# Patient Record
Sex: Female | Born: 1990 | Race: Black or African American | Hispanic: No | Marital: Married | Smoking: Never smoker
Health system: Southern US, Community
[De-identification: ages and names within clinical notes are randomized; demographics above are authoritative.]

## PROBLEM LIST (undated history)

## (undated) DIAGNOSIS — I10 Essential (primary) hypertension: Secondary | ICD-10-CM

## (undated) DIAGNOSIS — Z789 Other specified health status: Secondary | ICD-10-CM

## (undated) DIAGNOSIS — E119 Type 2 diabetes mellitus without complications: Secondary | ICD-10-CM

## (undated) HISTORY — PX: PERINEAL LACERATION REPAIR: SHX5389

## (undated) HISTORY — DX: Type 2 diabetes mellitus without complications: E11.9

## (undated) HISTORY — DX: Essential (primary) hypertension: I10

## (undated) HISTORY — PX: NO PAST SURGERIES: SHX2092

---
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
## 2019-07-31 ENCOUNTER — Ambulatory Visit (INDEPENDENT_AMBULATORY_CARE_PROVIDER_SITE_OTHER): Payer: Medicaid Other | Admitting: Family Medicine

## 2019-07-31 ENCOUNTER — Other Ambulatory Visit: Payer: Self-pay

## 2019-07-31 VITALS — BP 100/62 | HR 77 | Ht 64.5 in | Wt 143.0 lb

## 2019-07-31 DIAGNOSIS — Z23 Encounter for immunization: Secondary | ICD-10-CM | POA: Diagnosis not present

## 2019-07-31 DIAGNOSIS — Z3480 Encounter for supervision of other normal pregnancy, unspecified trimester: Secondary | ICD-10-CM | POA: Diagnosis not present

## 2019-07-31 DIAGNOSIS — Z3492 Encounter for supervision of normal pregnancy, unspecified, second trimester: Secondary | ICD-10-CM | POA: Insufficient documentation

## 2019-07-31 DIAGNOSIS — Z0289 Encounter for other administrative examinations: Secondary | ICD-10-CM | POA: Diagnosis not present

## 2019-07-31 DIAGNOSIS — Z3482 Encounter for supervision of other normal pregnancy, second trimester: Secondary | ICD-10-CM

## 2019-07-31 DIAGNOSIS — Z3201 Encounter for pregnancy test, result positive: Secondary | ICD-10-CM | POA: Diagnosis not present

## 2019-07-31 DIAGNOSIS — Z1389 Encounter for screening for other disorder: Secondary | ICD-10-CM | POA: Diagnosis not present

## 2019-07-31 LAB — POCT URINALYSIS DIP (MANUAL ENTRY)
Bilirubin, UA: NEGATIVE
Blood, UA: NEGATIVE
Glucose, UA: NEGATIVE mg/dL
Ketones, POC UA: NEGATIVE mg/dL
Nitrite, UA: NEGATIVE
Protein Ur, POC: NEGATIVE mg/dL
Spec Grav, UA: 1.025 (ref 1.010–1.025)
Urobilinogen, UA: 0.2 E.U./dL
pH, UA: 6.5 (ref 5.0–8.0)

## 2019-07-31 LAB — POCT UA - MICROSCOPIC ONLY: Epithelial cells, urine per micros: >20

## 2019-07-31 LAB — POCT URINE PREGNANCY: Preg Test, Ur: POSITIVE — AB

## 2019-07-31 NOTE — Patient Instructions (Addendum)
It was great seeing you today! Please follow up with your case manager to be sure you don't miss any appointments.   We'd like to see you back on 08/16/2019 for your OB visit  but if you need to be seen earlier than that for any new issues we're happy to fit you in, just give [place] a call!   If you have questions or concerns please do not hesitate to call at [phone].  [place]

## 2019-07-31 NOTE — Progress Notes (Addendum)
[name] Date of Birth: 12-Aug-1991 Date of Visit: 07/31/19 PCP: Katelyn Malay, MD  Chief Complaint: refugee intake examination and establish prenatal care.  The patient's preferred language is Swahili. An interpreter was used for the entire visit.  Interpreter Name or ID: Katelyn Sharp    Subjective: Katelyn Sharp is a pleasant 28 y.o. presenting today for an initial refugee and immigrant clinic visit. Has no concerns today. Denies recent illness. Patient is currently pregnant. She reports some intermittent fetal movement. She denies bleeding, loss of fluid, vaginal discharge. She has irregular menses. LMP is certain at 0/16/01. EDD December 24, 2019. She is at [redacted]w[redacted]d by LMP.  ROS: See HPI.  PMH: Malaria  PSH: None  FH: No known family history of hypertension, diabetes or heart disease.   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 Primary Language: Swahili/Kiswahili Able to Read in Primary Language: Yes Able to Write in Primary Language: Yes Education: [employer] Prior Work: Worked at home Marital Status: Married Sexual Activity: Yes Tuberculosis Screening Health Department: Not Completed Health Department Labs Completed: No(obtaining today) History of Trauma: None Do You Feel Jumpy or Nervous?: No Are You Very Watchful or 'Super Alert'?: No   Date of [place]verse[place] Exam: 11/27/2018 Review of [place]verse[place] Exam: yes, chest x-ray negative  Pre-Departure Treatment: yes    Vitals:   07/31/19 1428  BP: 100/62  Pulse: 77  SpO2: 100%     HEENT: Sclera anicteric. Dentition is moderate . Appears well hydrated. Neck: Supple Cardiac: Regular rate and rhythm. Normal S1/S2. No  murmurs, rubs, or gallops appreciated. Lungs: Clear bilaterally to ascultation.  Abdomen: Normoactive bowel sounds. No tenderness to deep or light palpation. No rebound or guarding. Splenomegaly- none FH at 21 cm  FHT 150 bpm   Extremities: Warm, well perfused without edema.  Skin: no rashes, no lesions, no scars on extremities or abdomen   Psych: Pleasant and appropriate  MSK: Full ROM of arms and LE    Katelyn Sharp was seen today for establish care.  Diagnoses and all orders for this visit:  Encounter for health examination of refugee -     Hepatitis c antibody (reflex) -     QuantiFERON-TB Gold Plus -     TSH -     POCT urine pregnancy- positive  -     POCT urinalysis dipstick -     POCT UA - Microscopic Only -     Varicella zoster antibody, IgG  Supervision of other normal pregnancy, antepartum -     Obstetric Panel, Including HIV([facility]) -     Culture, OB Urine -     HGB FRAC. W/SOLUBILITY -     POCT UA - Microscopic Only - Fetal well being reassuring -  Needs dating confirmed with ultrasound  -  Follow up labs, initial Ob scheduled in [place] clinic as next available initial OB is November.   Need for immunization against influenza -     Flu Vaccine QUAD 36+ mos IM   Return to care on 08/16/2019 in [facility] with resident physician and PCP.   Vaccines: Will need Tdap. Needs anatomy scan and further review of obstetric history at follow up.    Katelyn Hensen, MD  Katelyn Singh,  MD  Family Medicine Teaching Service

## 2019-08-01 ENCOUNTER — Telehealth: Payer: Self-pay | Admitting: Family Medicine

## 2019-08-01 DIAGNOSIS — R760 Raised antibody titer: Secondary | ICD-10-CM

## 2019-08-01 DIAGNOSIS — Z3482 Encounter for supervision of other normal pregnancy, second trimester: Secondary | ICD-10-CM

## 2019-08-01 NOTE — Telephone Encounter (Signed)
Pt [place] appointment was scheduled at first available.  It is scheduled for 08/15/2019 @ 2:00pm.  She should arrive approximately 15 mins before appointment. She is to go to [place] to have this completed. Dr. Owens Shark will be contacting pt and informing her of this. Melonie Germani Zimmerman Rumple, CMA

## 2019-08-01 NOTE — Telephone Encounter (Signed)
The patient non-English speaking. An interpreter was used for the entire phone call.    Called patient regarding prenatal labs.  Antibody screen returned positive she is O+ but her antibody screen flagged for possible multiple antibodies or potential high titer.  Discussed results with patient at length.  Also discussed with husband.  Discussed that her ultrasound is scheduled currently for October 21.  Discussed location time and date of this ultrasound appointment.  Permission given to discuss with her case Arts development officer.  Called OB on call to discuss positive antibody ID.  They will discuss  with MFM and call back.  Dorris Singh, MD  Family Medicine Teaching Service

## 2019-08-01 NOTE — Telephone Encounter (Signed)
Received call back from [facility] attending on call.  Recommends the following:  -Repeat antibody screen in third trimester.  If antibodies are still present the patient should be referred over to the [facility] to have testing done at our blood bank in the hospital. This would be so that blood be available in the case that she were to need a transfusion at time of delivery. -If the patient develops significant anemia or complications of pregnancy recommend she undergo further antibody screen at that time at the blood bank so that blood could be typed and crossed for her.  Will know on problem list and place on sticky.  Dorris Singh, MD  Family Medicine Teaching Service

## 2019-08-02 LAB — HEPATITIS C ANTIBODY (REFLEX): HCV Ab: <0.1 s/co ratio (ref 0.0–0.9)

## 2019-08-02 LAB — TSH: TSH: 2.67 u[IU]/mL (ref 0.450–4.500)

## 2019-08-02 LAB — OBSTETRIC PANEL, INCLUDING HIV
Basophils Absolute: 0 10*3/uL (ref 0.0–0.2)
Basos: 0 %
EOS (ABSOLUTE): 0.4 10*3/uL (ref 0.0–0.4)
Eos: 4 %
HIV Screen 4th Generation wRfx: NONREACTIVE
Hematocrit: 33.2 % — ABNORMAL LOW (ref 34.0–46.6)
Hemoglobin: 11.2 g/dL (ref 11.1–15.9)
Hepatitis B Surface Ag: NEGATIVE
Immature Grans (Abs): 0 10*3/uL (ref 0.0–0.1)
Immature Granulocytes: 0 %
Lymphocytes Absolute: 1.3 10*3/uL (ref 0.7–3.1)
Lymphs: 17 %
MCH: 32.3 pg (ref 26.6–33.0)
MCHC: 33.7 g/dL (ref 31.5–35.7)
MCV: 96 fL (ref 79–97)
Monocytes Absolute: 0.8 10*3/uL (ref 0.1–0.9)
Monocytes: 10 %
Neutrophils Absolute: 5.5 10*3/uL (ref 1.4–7.0)
Neutrophils: 69 %
Platelets: 236 10*3/uL (ref 150–450)
RBC: 3.47 x10E6/uL — ABNORMAL LOW (ref 3.77–5.28)
RDW: 13.1 % (ref 11.7–15.4)
RPR Ser Ql: NONREACTIVE
Rh Factor: POSITIVE
Rubella Antibodies, IGG: >33 index (ref >0.99)
WBC: 8 10*3/uL (ref 3.4–10.8)

## 2019-08-02 LAB — HGB FRAC. W/SOLUBILITY
Hgb A2 Quant: 2.7 % (ref 1.8–3.2)
Hgb A: 96.6 % (ref 96.4–98.8)
Hgb C: 0 %
Hgb F Quant: 0.7 % (ref 0.0–2.0)
Hgb S: 0 %
Hgb Solubility: NEGATIVE
Hgb Variant: 0 %

## 2019-08-02 LAB — URINE CULTURE, OB REFLEX

## 2019-08-02 LAB — CULTURE, OB URINE

## 2019-08-02 LAB — QUANTIFERON-TB GOLD PLUS
QuantiFERON Mitogen Value: 6.55 IU/mL
QuantiFERON Nil Value: 0.02 IU/mL
QuantiFERON TB1 Ag Value: 0.02 IU/mL
QuantiFERON TB2 Ag Value: 0.02 IU/mL
QuantiFERON-TB Gold Plus: NEGATIVE

## 2019-08-02 LAB — HCV COMMENT:

## 2019-08-02 LAB — VARICELLA ZOSTER ANTIBODY, IGG: Varicella zoster IgG: 765 index (ref >165)

## 2019-08-02 LAB — AB SCR+ANTIBODY ID: Antibody Screen: POSITIVE — AB

## 2019-08-03 ENCOUNTER — Encounter: Payer: Self-pay | Admitting: Family Medicine

## 2019-08-08 ENCOUNTER — Encounter: Payer: Medicaid Other | Admitting: Family Medicine

## 2019-08-15 ENCOUNTER — Ambulatory Visit (HOSPITAL_COMMUNITY)
Admission: RE | Admit: 2019-08-15 | Discharge: 2019-08-15 | Disposition: A | Discharge status: Home / Self Care | Payer: Medicaid Other | Source: Ambulatory Visit | Attending: Family Medicine | Admitting: Family Medicine

## 2019-08-15 ENCOUNTER — Other Ambulatory Visit (HOSPITAL_COMMUNITY): Payer: Self-pay | Admitting: *Deleted

## 2019-08-15 ENCOUNTER — Other Ambulatory Visit: Payer: Self-pay

## 2019-08-15 DIAGNOSIS — Z362 Encounter for other antenatal screening follow-up: Secondary | ICD-10-CM

## 2019-08-15 DIAGNOSIS — Z3482 Encounter for supervision of other normal pregnancy, second trimester: Secondary | ICD-10-CM | POA: Diagnosis not present

## 2019-08-15 DIAGNOSIS — O359XX0 Maternal care for (suspected) fetal abnormality and damage, unspecified, not applicable or unspecified: Secondary | ICD-10-CM

## 2019-08-15 DIAGNOSIS — Z3A21 21 weeks gestation of pregnancy: Secondary | ICD-10-CM

## 2019-08-15 DIAGNOSIS — Z363 Encounter for antenatal screening for malformations: Secondary | ICD-10-CM

## 2019-08-16 ENCOUNTER — Encounter: Payer: Self-pay | Admitting: Family Medicine

## 2019-08-16 ENCOUNTER — Ambulatory Visit: Payer: Medicaid Other

## 2019-08-16 ENCOUNTER — Other Ambulatory Visit: Payer: Self-pay

## 2019-08-16 ENCOUNTER — Other Ambulatory Visit (HOSPITAL_COMMUNITY)
Admission: RE | Admit: 2019-08-16 | Discharge: 2019-08-16 | Disposition: A | Discharge status: Home / Self Care | Payer: Medicaid Other | Source: Ambulatory Visit | Attending: Family Medicine | Admitting: Family Medicine

## 2019-08-16 ENCOUNTER — Ambulatory Visit (INDEPENDENT_AMBULATORY_CARE_PROVIDER_SITE_OTHER): Payer: Medicaid Other | Admitting: Family Medicine

## 2019-08-16 VITALS — BP 102/60 | HR 96 | Wt 147.0 lb

## 2019-08-16 DIAGNOSIS — Z3482 Encounter for supervision of other normal pregnancy, second trimester: Secondary | ICD-10-CM

## 2019-08-16 DIAGNOSIS — Z3490 Encounter for supervision of normal pregnancy, unspecified, unspecified trimester: Secondary | ICD-10-CM | POA: Insufficient documentation

## 2019-08-16 MED ORDER — PRENATAL VITAMIN 27-0.8 MG PO TABS: 1.0000 | ORAL_TABLET | Freq: Every day | ORAL | Status: DC

## 2019-08-16 MED ORDER — POLYETHYLENE GLYCOL 3350 17 GM/SCOOP PO POWD: 17.0000 g | Freq: Every day | ORAL | 1 refills | Status: DC

## 2019-08-16 NOTE — Progress Notes (Deleted)
[name] Date of Birth: 05-29-1991 [place] Initial Prenatal   Katelyn Sharp is a 28 y.o. year old G***P*** at Unknown who presents for her initial prenatal visit. Pregnancy  {Is/is not:9024}planned She reports {pregnancy symptoms:18128}. She  {Is/is not:9024} taking a prenatal vitamin.  She denies pelvic pain or vaginal bleeding.   The patient is dated by ***.  LMP: *** Period is certain:  {ERX/VQ:00867}, if not certain LMP, needs dating ultrasound  Periods are regular:  {yes/no:20286}, if no needs dating ultrasound  Using hormonal contraception in 3 months prior to conception:  {yes/no:20286}, if yes, needs dating ultrasound  Period was typical period:  {yes/no:20286}, if no, needs dating ultrasound   Lab Review: O Antibody screen {NEGATIVE/POSITIVE FOR:19998::"Negative"} if positive, discuss with Ob on call  HIV {NEGATIVE/POSITIVE FOR:19998::"Negative"} RPR {NEGATIVE/APPRO/POSITIVE FOR:20006::"Negative"} Hemoglobin electrophoresis reviewed {yes/no:20286::"Yes"} Results of Ob urine culture are {NEGATIVE/POSITIVE FOR:19998::"Negative"} Rubella {Desc; immune/not/unknown:31571::"Immune"} Varicella status is {Desc; immune/not/unknown:31571::"Immune"}. If unknown, varicella IgG ordered. If non-immune, will need vaccination after delivery.   PMH: Reviewed and as detailed below: HTN: {yes/no:20286::"No"} if yes, consider aspirin after 12 weeks and refer to High Risk Ob  Type 1 or 2 Diabetes: {yes/no:20286::"No"} f yes, refer to High Risk Ob  Depression:  {yes/no:20286::"No"}  Seizure disorder:  {yes/no:20286::"No"} if yes, refer to Neurology and High Risk Ob  VTE: {yes/no:20286::"No"} , if yes,refer to High Risk Ob  History of STI {yes/no:20286::"No"}, if yes, repeat testing obtained  Abnormal Pap smear:  {yes/no:20286::"No"}, Genital herpes simplex:  {yes/no:20286::"No"}  if yes, valacyclovir at 36w  PSH: Gynecologic Surgery:  {No/   **:31982:o:"no"} Surgical history reviewed.   Obstetric History: Obstetric history tab updated and reviewed.  Cesarean delivery: {yes/no:20286::"No"} if yes, schedule with Dr. Kennon Rounds at 32w Gestational Diabetes:  {yes/no:20286::"No"},  obtain early 1 hour glucose tolerance test Hypertension in pregnancy: {yes/no:20286::"No"},  consider aspirin therapy starting at 12 weeks  Prior pregnancies: History of preterm birth: {YPP/JK:93267::"TI"} Complications with prior pregnancies: {Pregnancy complications:20338} History of LGA/SGA infant:  {yes/no:20286::"No"} History of shoulder dystocia: {yes/no:20286::"No"} Indications for referral were reviewed, the patient has no obstetric indications for referral to High Risk Ob at this time.   Social History: Partner's name: ***  Tobacco use: {yes/no:20286::"No"} Alcohol use:  {yes/no:20286::"No"} Other substance use:  {yes/no:20286::"No"}  Current Medications:  ***  Reviewed and appropriate in pregnancy.   Genetic and Infection Screen: Flow Sheet Updated {yes/no:20286::"Yes"}  Prenatal Exam: Gen: Well nourished, well developed.  No distress.  Vitals noted. HEENT: Normocephalic, atraumatic.  Neck supple without cervical lymphadenopathy, thyromegaly or thyroid nodules.  fair dentition. CV: RRR no murmur, gallops or rubs Lungs: CTA B.  Normal respiratory effort without wheezes or rales. Abd: soft, NTND. +BS.  Uterus not appreciated above pelvis. GU: Normal external female genitalia without lesions.  Nl vaginal, well rugated without lesions. No vaginal discharge.  Bimanual exam: No adnexal mass or TTP. No CMT.  Uterus size *** Ext: No clubbing, cyanosis or edema. Psych: Normal grooming and dress.  Not depressed or anxious appearing.  Normal thought content and process without flight of ideas or looseness of associations  Assessment/plan: 1) Pregnancy Unknown doing well.  Current pregnancy issues include ***. As dating {ACTION; IS/IS  NOT:21021397::"is not"}, a dating ultrasound {HAS HAS NOT:18834::"has"} been ordered. Dating tab updated. Prepregnancy weight updated and expected weight gain this pregnancy is {weight gain pregnancy :23296::"25-35 pounds "} Prenatal labs reviewed, notable for ***.  Indications for referral to HROB were reviewed and the patient {DOES NOT does:27190::"does  not"} meet criteria for referral.   Medication list reviewed and updated to include only medications current medications.  Recommended patient see a dentist for regular care.  Prescribed vitamin B6 and doxylamine (Diclegis), with discussion of dose titration. Bleeding and pain precautions reviewed. Importance of prenatal vitamins reviewed.  Genetic screening offered, including first trimester screen with nuchal translucency at 11-13 weeks or quad screen at 16-20 weeks.  The patient {CHL AMB IS/IS NOT:210130109} age 34 or over at time of delivery. Referral to genetics {WAS/WAS NOT:[phone]::"was not"} offered today.  The patient {DOES NOT does:27190::"does not"} meet criteria for an early 1 hour glucola testing for diabetes. She will undergo routine 1 hour screening at 24-28 weeks.  PMH and PHQ9 forms completed.  Early glucola {ACTION; IS/IS AST:41962229} indicated.    Follow up 4 weeks.

## 2019-08-16 NOTE — Progress Notes (Addendum)
[name] Date of Birth: 07/14/1991 [place] Initial Prenatal   The patient speaks Swahili as their primary language.  An interpreter was used for the entire visit.   CC: Prenatal visit, initial prenatal   HPI:  Katelyn Sharp is a 28 y.o. year old G33P3003 at [redacted]w[redacted]d who presents for her initial prenatal visit. Pregnancy  is planned She reports fatigue. No other concerns.  She  is taking a prenatal vitamin.  She denies pelvic pain or vaginal bleeding. She reports fetal movement.  The patient recently moved to [place] from [place] with her husband and 3 children.  She reports overall she is doing well.  She reports she is very at the past through and is looking forward to her fourth baby.  The patient is dated by LMP currently.  LMP: 9/56/2130 Period is certain:  Yes, if not certain LMP, needs dating ultrasound  Periods are regular:  Yes, if no needs dating ultrasound  Using hormonal contraception in 3 months prior to conception:  No, if yes, needs dating ultrasound  Period was typical period:  Yes, if no, needs dating ultrasound   Lab Review: ABO O+  Antibody screen Positive for multiple antibodies, needs repeat at next visit. Already discussed with Ob on call, see problem list.  HIV Negative RPR Negative Hemoglobin electrophoresis reviewed Yes Results of Ob urine culture are Negative Rubella Immune Varicella status is Immune.  PMH: Reviewed and as detailed below: HTN: No Type 1 or 2 Diabetes: No Depression:  No  Seizure disorder:  No VTE: No  History of STI No,  Abnormal Pap smear:  No, unknown, never had a pap  Genital herpes simplex:  No 36w  PSH: Gynecologic Surgery:  no Surgical history reviewed.   Obstetric History: Obstetric history tab updated and reviewed.  Cesarean delivery: No if yes, schedule with Dr. Kennon Rounds at 32w Gestational Diabetes:  No,  obtain early 1 hour glucose tolerance test Hypertension in pregnancy: No,   consider aspirin therapy starting at 12 weeks  Prior pregnancies: The patient has had 3 prior pregnancies.  Her G1 pregnancy was complicated by only requiring episiotomy at time of delivery this was repaired she did not require repeat episiotomy and G2 G3 pregnancy.  She has no history of needing transfusions. History of preterm birth: No Complications with prior pregnancies: Episiotomy in G1 History of LGA/SGA infant:  No History of shoulder dystocia: No Indications for referral were reviewed, the patient has no obstetric indications for referral to High Risk Ob at this time.   Social History: Partner's name: Biya   Tobacco use: No Alcohol use:  No Other substance use:  No  Current Medications:  Prenatal vitamins.   Reviewed and appropriate in pregnancy.   Genetic and Infection Screen: Flow Sheet Updated Yes  Prenatal Exam: Vitals:   08/16/19 0949  BP: 102/60  Pulse: 96    Gen: Well nourished, well developed.  No distress.  Vitals noted. Cardiac: Warm well perfused.  Capillary refill less than 3 seconds Respiratory breathing comfortably on room air Psych: Pleasant normal affect, appropriate, normal rate of speech Full exam performed at initial visit last week.  QM:VHQIONGEXB exam. Normal external female genitalia without lesions.  Nl vaginal, well rugated without lesions. No vaginal discharge.  Bimanual exam: No adnexal mass or TTP. No CMT.  Uterus size 20 weeks in gestation.  Ext: No clubbing, cyanosis or edema. Psych: Normal grooming and dress.  Not depressed or anxious appearing.  Normal  thought content and process without flight of ideas or looseness of associations  Assessment/plan: 1) Pregnancy Unknown doing well.  Current pregnancy issues include  Positive antibody screen. Reviewed MFM recommendations. Repeat at next visit and send to [facility] bank.  Patient is currently dated by LMP.  She is certain of her last menstrual period.  This is discrepant from her  ultrasound, MFM has recommended possible redating depending on growth.  Will follow closely. By ACOG criteria, the difference in dating does not meet criteria for redating.  Prepregnancy weight updated and expected weight gain this pregnancy is 25-35 pounds  Prenatal labs reviewed, notable for as above.  Indications for referral to HROB were reviewed and the patient does not meet criteria for referral.   Medication list reviewed and updated to include only medications current medications.  Recommended patient see a dentist for regular care.  Prescribed vitamin B6 and doxylamine (Diclegis), with discussion of dose titration. Bleeding and pain precautions reviewed. Importance of prenatal vitamins reviewed.  The patient is not age 30 or over at time of delivery. Referral to genetics was not offered today.  Discussed genetic testing which she declined. The patient does not meet criteria for an early 1 hour glucola testing for diabetes. She will undergo routine 1 hour screening at 24-28 weeks.  PMH  completed. PHQ not completed as not validated in Swahili.    Follow up 4 weeks.   Terisa Starr, MD  Family Medicine Teaching Service

## 2019-08-16 NOTE — Patient Instructions (Signed)
   It was wonderful to see you today.  Thank you for choosing [facility].   Please call [phone] with any questions about today's appointment.  Please be sure to schedule follow up at the front  desk before you leave today.   Dorris Singh, MD  Family Medicine   We will see you on November 10th at 1:55 PM

## 2019-08-22 LAB — CYTOLOGY - PAP
Chlamydia: NEGATIVE
Comment: NEGATIVE
Comment: NORMAL
Diagnosis: NEGATIVE
Neisseria Gonorrhea: NEGATIVE

## 2019-08-24 ENCOUNTER — Telehealth: Payer: Self-pay | Admitting: Family Medicine

## 2019-08-24 DIAGNOSIS — B3731 Acute candidiasis of vulva and vagina: Secondary | ICD-10-CM

## 2019-08-24 DIAGNOSIS — B373 Candidiasis of vulva and vagina: Secondary | ICD-10-CM

## 2019-08-24 MED ORDER — TERCONAZOLE 0.8 % VA CREA: 1.0000 | TOPICAL_CREAM | Freq: Every day | VAGINAL | 0 refills | Status: AC

## 2019-08-24 NOTE — Telephone Encounter (Signed)
Called patient with Swahili interpreter. Terconazole sent to pharmacy for vaginal yeast infection on Pap. Discussed at length.  Please confirm patient has picked up and used medication at follow up.   Dorris Singh, MD  Family Medicine Teaching Service

## 2019-09-04 ENCOUNTER — Ambulatory Visit (INDEPENDENT_AMBULATORY_CARE_PROVIDER_SITE_OTHER): Payer: Medicaid Other | Admitting: Family Medicine

## 2019-09-04 ENCOUNTER — Encounter: Payer: Self-pay | Admitting: Family Medicine

## 2019-09-04 ENCOUNTER — Other Ambulatory Visit: Payer: Self-pay

## 2019-09-04 VITALS — BP 102/60 | HR 83 | Wt 152.0 lb

## 2019-09-04 DIAGNOSIS — R768 Other specified abnormal immunological findings in serum: Secondary | ICD-10-CM

## 2019-09-04 DIAGNOSIS — Z3480 Encounter for supervision of other normal pregnancy, unspecified trimester: Secondary | ICD-10-CM | POA: Diagnosis not present

## 2019-09-04 LAB — POCT 1 HR PRENATAL GLUCOSE: Glucose 1 Hr Prenatal, POC: 103 mg/dL

## 2019-09-04 NOTE — Patient Instructions (Addendum)
Dear Katelyn Sharp,   It was good to see you! Thank you for taking your time to come in to be seen. Today, we discussed the following:   Routine Prenatal Visit Continue to take your prenatal vitamins. Please go to the MAU or call us if you have any concerns about the pregnancy.   Ilikuwa nzuri [place]! Asante kwa kuchukua muda wako [place]. Tacy Learn yafuatayo:  Ziara ya [place] Mara kwa Mara [place] kuchukua vitamini vyako kabla ya kujifungua. Tafadhali nenda kwa MAU au utupigie simu ikiwa una wasiwasi wowote juu ya ujauzito.  Be well,   Zettie Cooley, M.D   [facility] [phone]  *Sign up for MyChart for instant access to your health profile, labs, orders, upcoming appointments or to contact your provider with questions*  ===================================================================================   Please go to the Maternity Admissions Unit in the [place] at [place] will use hospital Entrance C.  Tafadhali nenda [place] Kit[place] cha [place] wa [place] Kituo cha [place] na [place] katika Hospitali ya [place]. [place] Kiingilio cha hospitali C.

## 2019-09-04 NOTE — Progress Notes (Signed)
  [name] Date of Birth: Jun 21, 1991 Date of Visit: 09/04/19 PCP: Martyn Malay, MD  Chief Complaint: prenatal care  Subjective: Katelyn Sharp is a pleasant [phone] at  24+1 dated by known LMP. Recent fetal anatomy US measured 22w+3 (1w+1d discrepancy) Working date remains LMP. She has no unusual complaints today. Reports good fetal movement. Denies contractions, loss of fluid, or bleeding.   ROS:  Review of Systems  Constitutional: Negative for malaise/fatigue.  Eyes: Negative for blurred vision.  Cardiovascular: Negative for leg swelling.  Gastrointestinal: Negative for nausea and vomiting.  Genitourinary: Negative for dysuria.  Neurological: Negative for headaches.   I have reviewed the patient's medical, surgical, family, and social history as appropriate.   Pertinent PMH: No significant past medical history   Prior Obstetric History: Episiotomy in G1   Complications of Current Pregnancy: Late prenatal care @ 21+3  Vitals:   09/04/19 1356  BP: 102/60  Pulse: 83   Last Weight  Most recent update: 09/04/2019  1:58 PM   Weight  68.9 kg (152 lb)           Pregravid weight not on file Could not be calculated Filed Weights   09/04/19 1356  Weight: 152 lb (68.9 kg)    Katelyn Sharp was seen today for routine prenatal visit.  Diagnoses and all orders for this visit:  Supervision of other normal pregnancy, antepartum -     Cancel: Antibody Screen -     POCT 1 Hr Prenatal Glucose -     Antibody screen; Future   Anticipatory Guidance and Prenatal Education provided on the following topics: - Preterm labor signs  - Reasons to present to MAU - Nutrition in pregnancy - Contraception postpartum - Breastfeed - Safe sleep for infant   Today:  - 1 hr GTT - repeat antibody screen per Dr. Owens Shark  Return in 4 weeks - Tdap at next visit  ADDENDUM ----------------------------- GTT - passed   Antibody screen positive  Spoke to [facility] attending on call, Dr.  Ilda Basset. He recommends placing referral to MFM for patient o be seen for further evaluation. Patient can continue to follow with [place] in the mean time.  - sending referral to OB/GYN- MFM  Zettie Cooley, MD [facility] Medicine Teaching Service

## 2019-09-06 LAB — ANTIBODY SCREEN: Antibody Screen: POSITIVE

## 2019-09-07 ENCOUNTER — Encounter: Payer: Self-pay | Admitting: Family Medicine

## 2019-09-07 ENCOUNTER — Telehealth: Payer: Self-pay | Admitting: Family Medicine

## 2019-09-07 DIAGNOSIS — O361920 Maternal care for other isoimmunization, second trimester, not applicable or unspecified: Secondary | ICD-10-CM | POA: Insufficient documentation

## 2019-09-07 DIAGNOSIS — O361910 Maternal care for other isoimmunization, first trimester, not applicable or unspecified: Secondary | ICD-10-CM | POA: Insufficient documentation

## 2019-09-07 NOTE — Telephone Encounter (Signed)
Called MFM to schedule consultation as recommended by Dr. Ilda Basset.   Scheduled for same day as ultrasound. Attempted to call case manager, left voicemail. Will call again next week. Also needs PNV at [facility] scheduled.   Dorris Singh, MD  Family Medicine Teaching Service

## 2019-09-10 NOTE — Telephone Encounter (Signed)
Called patient with Swahili interpreter. Total time 16 minutes coordinating care. 28 week visit scheduled at [facility]. Discussed time, date, and location of upcoming MFM appointment and ultrasound. All questions answered. Teachback method used with interpreter to confirm patient knows time/date of upcoming appointments.  Dorris Singh, MD  Family Medicine Teaching Service

## 2019-09-12 ENCOUNTER — Ambulatory Visit (HOSPITAL_COMMUNITY): Payer: Medicaid Other

## 2019-09-12 ENCOUNTER — Ambulatory Visit (HOSPITAL_COMMUNITY)
Admission: RE | Admit: 2019-09-12 | Discharge: 2019-09-12 | Disposition: A | Discharge status: Home / Self Care | Payer: Medicaid Other | Source: Ambulatory Visit | Attending: Obstetrics and Gynecology | Admitting: Obstetrics and Gynecology

## 2019-09-12 ENCOUNTER — Other Ambulatory Visit: Payer: Self-pay

## 2019-09-12 ENCOUNTER — Ambulatory Visit (HOSPITAL_COMMUNITY): Payer: Medicaid Other | Admitting: *Deleted

## 2019-09-12 ENCOUNTER — Encounter (HOSPITAL_COMMUNITY): Payer: Self-pay

## 2019-09-12 VITALS — BP 116/66 | HR 81 | Temp 97.8°F

## 2019-09-12 DIAGNOSIS — O0990 Supervision of high risk pregnancy, unspecified, unspecified trimester: Secondary | ICD-10-CM

## 2019-09-12 DIAGNOSIS — O359XX0 Maternal care for (suspected) fetal abnormality and damage, unspecified, not applicable or unspecified: Secondary | ICD-10-CM | POA: Diagnosis not present

## 2019-09-12 DIAGNOSIS — O288 Other abnormal findings on antenatal screening of mother: Secondary | ICD-10-CM

## 2019-09-12 DIAGNOSIS — Z362 Encounter for other antenatal screening follow-up: Secondary | ICD-10-CM | POA: Insufficient documentation

## 2019-09-12 DIAGNOSIS — Z3A26 26 weeks gestation of pregnancy: Secondary | ICD-10-CM | POA: Diagnosis not present

## 2019-09-14 ENCOUNTER — Telehealth: Payer: Self-pay | Admitting: Family Medicine

## 2019-09-14 DIAGNOSIS — O361920 Maternal care for other isoimmunization, second trimester, not applicable or unspecified: Secondary | ICD-10-CM

## 2019-09-14 NOTE — Telephone Encounter (Signed)
Called patient using [name].  Lab appointment on 09/17/19 at 1300.  Katelyn Sharp, [name]

## 2019-09-14 NOTE — Telephone Encounter (Signed)
Reviewed most recent ultrasound.  Ultrasound has redated patient estimated due date of 16 December 2019.    Regarding the patient's antibody titer no consultation was completed at her MFM appointment.  Called the office to find out additional information but it appears they felt this has been addressed.  Spoke with Dr. Kennon Rounds who recommended the following:  will need to send titer, will need to send every 4 weeks. If >1:16 will need referral to MFM and High Risk Ob. Can continue care with [facility] at this time.   This should be repeated every 4 weeks in pregnancy, [id number].   Nursing please call patient and help her to schedule a lab appointment next week.  This is just to check her antibody which she has previously been counseled about.  This can be any day of the week preferably earlier in the week.  The lab has been ordered as a future order.  Dorris Singh, MD  Family Medicine Teaching Service

## 2019-09-17 ENCOUNTER — Other Ambulatory Visit: Payer: Medicaid Other

## 2019-09-17 ENCOUNTER — Other Ambulatory Visit: Payer: Self-pay

## 2019-09-17 DIAGNOSIS — O361920 Maternal care for other isoimmunization, second trimester, not applicable or unspecified: Secondary | ICD-10-CM

## 2019-09-19 LAB — ANTIBODY IDENTIFICATION

## 2019-09-19 NOTE — Congregational Nurse Program (Signed)
Katelyn Sharp came in for Blood pressure check during pregnancy. She has no complains. Education provided on diet during pregnancy and importance of prenatal vitamins. Honor Loh RN BSn PCCN [phone]

## 2019-09-23 ENCOUNTER — Telehealth: Payer: Self-pay | Admitting: Family Medicine

## 2019-09-23 DIAGNOSIS — O361920 Maternal care for other isoimmunization, second trimester, not applicable or unspecified: Secondary | ICD-10-CM

## 2019-09-23 NOTE — Telephone Encounter (Signed)
Lab resulted without definitive result---patient will need repeat Type and Screen with antibody titer sent to [facility] Blood bank. This has been ordered.   Nursing- Please call patient. Her blood specimen did not result properly (lab issue, not patient issue). She needs a repeat specimen obtained this week and sent to [facility]. Routing to [place] as well.  Let me know if she has questions or if you have concerns.  Thanks!

## 2019-09-24 ENCOUNTER — Encounter: Payer: Self-pay | Admitting: Family Medicine

## 2019-09-24 NOTE — Telephone Encounter (Signed)
Called patient and she states that she has already completed this repeat lab on 09/17/2019.  Not sure if patient is confused or not.  Call was not clear and patient had to be redialed several times.  Language Resources was used via [place] # [phone].  Ozella Almond, [place]

## 2019-09-24 NOTE — Telephone Encounter (Signed)
The patient speaks Swahili as their primary language.  An interpreter was used for the entire call. Left voicemail to return call.  Tried to reach patient regarding need for blood work--she did have blood work drawn last week but it did not run properly. Needs repeat this week. Left voicemail.   Nursing- can we please try to reach her again tomorrow?  Thanks, [employer]

## 2019-09-25 DIAGNOSIS — Z3492 Encounter for supervision of normal pregnancy, unspecified, second trimester: Secondary | ICD-10-CM

## 2019-09-25 LAB — GLUCOSE, POCT (MANUAL RESULT ENTRY): POC Glucose: 92 mg/dl (ref 70–99)

## 2019-09-25 NOTE — Congregational Nurse Program (Signed)
  Dept: [place] Nurse Program Note  Date of Encounter: 09/25/2019  Past Medical History: Past Medical History:  Diagnosis Date  . Medical history non-contributory     Encounter Details: CNP Questionnaire - 09/25/19 1207      Questionnaire   Patient Status  Refugee    Race  African    Location Patient Served At  [place]  Medicaid    Uninsured  Not Applicable    Food  Yes, have food insecurities    Housing/Utilities  Yes, have permanent housing    Transportation  Yes, need transportation assistance    Interpersonal Safety  Yes, feel physically and emotionally safe where you currently live    Medication  No medication insecurities    Medical Provider  Yes    Referrals  Not Applicable    ED Visit Averted  Not Applicable    Life-Saving Intervention Made  Not Applicable      Client is pregnant in second trimestert. Came in for blood pressure screening and blood sugar check.We have educated her about prenatal vitamins and diet. Provided with Congregational Nurse phone number for any questions or concerns. Marguerite Olea, RN, CNP [phone]

## 2019-09-26 NOTE — Telephone Encounter (Signed)
Looks like she got scheduled to see dr Tammi Klippel on 12/7. Katelyn Sharp, CMA

## 2019-09-26 NOTE — Telephone Encounter (Signed)
LM with husband for her to call back and make an appt, using swahili interpreter caroline [phone]. Deseree Kennon Holter, CMA

## 2019-09-26 NOTE — Telephone Encounter (Signed)
Ideally would be this week--can you please reach out to patient as below.   Thank you,  Dorris Singh, MD  [facility] Medicine Teaching Service

## 2019-09-28 ENCOUNTER — Other Ambulatory Visit: Payer: Self-pay

## 2019-09-28 ENCOUNTER — Other Ambulatory Visit: Payer: Self-pay | Admitting: Family Medicine

## 2019-09-28 ENCOUNTER — Telehealth: Payer: Self-pay | Admitting: Family Medicine

## 2019-09-28 ENCOUNTER — Other Ambulatory Visit: Payer: Medicaid Other

## 2019-09-28 DIAGNOSIS — O361920 Maternal care for other isoimmunization, second trimester, not applicable or unspecified: Secondary | ICD-10-CM

## 2019-09-28 DIAGNOSIS — R760 Raised antibody titer: Secondary | ICD-10-CM

## 2019-09-28 NOTE — Telephone Encounter (Signed)
Patient presented for labs. Titers drawn. This was taken to Blood Bank by Herbie Baltimore. Spoke with Blood Bank - in future, will possibly need 4 lavender tubes. - [facility] picked up tubes today, will run.  Patient does NOT need antibody screen on Monday. Will still need RPR, HIV, CBC.   Dr. Tammi Klippel, let me know if you have questions.  Dorris Singh, MD  Family Medicine Teaching Service

## 2019-09-28 NOTE — Telephone Encounter (Signed)
Entered in error

## 2019-09-29 NOTE — Progress Notes (Deleted)
  [name] Date of Birth: 03/16/1991 Date of Visit: 09/29/19 PCP: Martyn Malay, MD Third Trimester Prenatal Visit   Chief Complaint: prenatal care  Subjective: Katelyn Sharp is a pleasant [phone] at  [redacted]w[redacted]d dated by 2nd trimester [place].She has no unusual complaints today.  She denies vaginal bleeding or discharge. She reports good fetal movement. She denies contractions. See flowsheet for details    ROS: per HPI.   I have reviewed the patient's medical, surgical, family, and social history as appropriate.  Labs reviewed: ABO O+, Antibody screen Positive for multiple antibodies, RPR Negative, Hemoglobin electrophoresis wnl, results of Ob urine culture are Negative, Rubella Immune, Varicella status is Immune. [place] reviewed:@[redacted]w[redacted]d , CWD, normal female anatomy, cephalic presentation, anterior placental lie, 953g, 44% EFW  Pregnancy issues include:  Positive antibody screen - Anti M Per notes from Dr. Owens Shark, Dr. Kennon Rounds was consulted and recommended that patient have repeat titers q4weeks. If >1:16 will need referral to MFM and High Risk Ob. Can continue care with [facility] at this time.   Late prenatal care Initiated care at [redacted]w[redacted]d  There were no vitals filed for this visit.  See flow sheet for exam.  Fetal heart tones documented in flow sheet  Fundal height documented in flow sheet   A/P: Katelyn Sharp is a pleasant [phone] at [redacted]w[redacted]d presenting for routine third trimester prenatal care. Overall she is doing well.   Dating reviewed, dating tab is {correct:23336::"correct"} Fetal heart tones {appropriate:23337} Fundal height {fundal height:23342::"within expected range. "} Pregnancy issues include ***. Problem list updated {yes/no:20286::"Yes"}.   Infant feeding choice: {Breasfeeding:23347::"Breastfeeding"} Contraception choice: {postpartum contraception:23348} Infant circumcision desired {Response; yes/no/na:63} The patient {CWHCS:23409::"does not have a history of Cesarean  delivery and no referral to Center for [facility] is indicated"}  Influenza vaccine previously administered.   Tdap {was/was not:19854::"was"} given today. 1 hour glucola previously obtained on 09/04/19, wnl 103 CBC, RPR, and HIV {WERE / WERE NOT:19253::"were"} obtained today.   Pregnancy medical home and PHQ-9 forms {WERE / WERE NOT:19253} done today and reviewed.   Rh status was reviewed and patient {does/does not:19886} need Rhogam.  Rhogam {WAS/WAS NOT:[phone]::"was not"} given today.   Childbirth and education classes {WERE / WERE YWV:37106} offered. Pregnancy education regarding benefits of breastfeeding, contraception, fetal growth, expected weight gain, and safe infant sleep were discussed.  Preterm labor and fetal movement precautions reviewed. Patient scheduled in [facility] during third trimester on ***.  Follow up 2 weeks.  Anticipatory Guidance and Prenatal Education provided on the following topics: - Recommended continuing Prenatal vitamin  - Discussed options for Contraception postpartum - Discussed mode of delivery - Discussed pain control in labor - Discussed  breastfeeding and benefits for infant  - Discussed safe sleep recommendations and car seat recommendations  - Problem list and prenatal box updated   Tasks ***  - At 28 weeks, 1 hour GTT, RPR, CBC, and HIV  - If Rh negative, collect 'antibody screen' and administer RhoGAM  - If history of genital herpes, valacyclovir 500 mg BID starting at 36w - Between 27-32 weeks, Tdap vaccine - Schedule in [facility] during  - PMH and PHQ form at 28 weeks  - Update Ob box in problem list  *** delete before signing   Follow up in 2 weeks until 36 weeks then weekly from 36 weeks until delivered.

## 2019-10-01 ENCOUNTER — Encounter: Payer: Medicaid Other | Admitting: Family Medicine

## 2019-10-01 ENCOUNTER — Telehealth: Payer: Self-pay | Admitting: Family Medicine

## 2019-10-01 NOTE — Telephone Encounter (Addendum)
Preliminary result of antibody screen---too weak to titer. Will repeat in 4 weeks time. Added to problem list.   Dorris Singh, MD  [facility] Medicine Teaching Service

## 2019-10-04 ENCOUNTER — Other Ambulatory Visit: Payer: Self-pay

## 2019-10-04 ENCOUNTER — Ambulatory Visit (INDEPENDENT_AMBULATORY_CARE_PROVIDER_SITE_OTHER): Payer: Medicaid Other | Admitting: Obstetrics and Gynecology

## 2019-10-04 ENCOUNTER — Encounter: Payer: Self-pay | Admitting: Obstetrics and Gynecology

## 2019-10-04 VITALS — BP 116/76 | HR 94 | Wt 151.0 lb

## 2019-10-04 DIAGNOSIS — O361920 Maternal care for other isoimmunization, second trimester, not applicable or unspecified: Secondary | ICD-10-CM

## 2019-10-04 DIAGNOSIS — Z3A29 29 weeks gestation of pregnancy: Secondary | ICD-10-CM | POA: Diagnosis not present

## 2019-10-04 DIAGNOSIS — Z3480 Encounter for supervision of other normal pregnancy, unspecified trimester: Secondary | ICD-10-CM

## 2019-10-04 DIAGNOSIS — Z23 Encounter for immunization: Secondary | ICD-10-CM | POA: Diagnosis not present

## 2019-10-04 DIAGNOSIS — Z789 Other specified health status: Secondary | ICD-10-CM | POA: Diagnosis not present

## 2019-10-04 DIAGNOSIS — O361930 Maternal care for other isoimmunization, third trimester, not applicable or unspecified: Secondary | ICD-10-CM

## 2019-10-04 MED ORDER — BLOOD PRESSURE MONITORING DEVI: 1.0000 | 0 refills | Status: DC

## 2019-10-04 NOTE — Progress Notes (Signed)
INITIAL PRENATAL VISIT NOTE  Subjective:  Katelyn Sharp is a 28 y.o. G4P3003 at [redacted]w[redacted]d by 2nd trim [place] being seen today for her initial prenatal visit. She is transferring from [facility] for antibodies. She has an obstetric history significant for 3 x term SVD. She has a medical history significant for .  Patient reports no complaints.  Contractions: Not present. Vag. Bleeding: None.  Movement: Present. Denies leaking of fluid.    Past Medical History:  Diagnosis Date  . Medical history non-contributory     Past Surgical History:  Procedure Laterality Date  . PERINEAL LACERATION REPAIR     G1 pregnancy     OB History  Gravida Para Term Preterm AB Living  4 3 3  0 0 3  SAB TAB Ectopic Multiple Live Births               # Outcome Date GA Lbr Len/2nd Weight Sex Delivery Anes PTL Lv  4 Current           3 Term           2 Term           1 Term             Obstetric Comments  G1- 2014- SVD of boy, 2.5 kg- had episiotomy in G1    G2- 2016- [name], SVD, girl, 2.5 kg   G3- [name]- 2018- SVD, 2.5 kg     Social History   Socioeconomic History  . Marital status: Married    Spouse name: Not on file  . Number of children: Not on file  . Years of education: Not on file  . Highest education level: Not on file  Occupational History  . Not on file  Tobacco Use  . Smoking status: Never Smoker  . Smokeless tobacco: Never Used  Substance and Sexual Activity  . Alcohol use: Never  . Drug use: Never  . Sexual activity: Yes    Partners: Male    Birth control/protection: None  Other Topics Concern  . 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 Difficulty of Paying Living Expenses: Not on file  Food Insecurity:   . Worried About Charity fundraiser in the Last Year: Not on file  . Ran Out of Food in the Last Year: Not on file  Transportation Needs:   . Lack of Transportation (Medical): Not on file  . Lack of Transportation (Non-Medical): Not on file  Physical Activity:   . Days of Exercise per Week: Not  on file  . Minutes of Exercise per Session: Not on file  Stress:   . Feeling of Stress : Not on file  Social Connections:   . Frequency of Communication with Friends and Family: Not on file  . Frequency of Social Gatherings with Friends and Family: Not on file  . Attends Religious Services: Not on file  . Active Member of Clubs or Organizations: Not on file  . Attends Banker Meetings: Not on file  . Marital Status: Not on file    Family History  Problem Relation Age of Onset  . Bleeding Disorder Neg Hx   . Hypertension Neg Hx   . Diabetes Neg Hx      Current Outpatient Medications:  .  polyethylene glycol powder (GLYCOLAX/MIRALAX) 17 GM/SCOOP powder, Take 17 g by mouth daily. Mix 1 capful with 8 ounces, Disp: 3350 g, Rfl: 1 .  Prenatal Vit-Fe Fumarate-FA (PRENATAL VITAMIN) 27-0.8 MG TABS, Take 1 tablet by mouth daily., Disp: 30 tablet, Rfl:   No Known Allergies  Review of Systems: Negative except for what is mentioned in HPI.  Objective:   Vitals:   10/04/19 0857  BP: 116/76  Pulse: 94  Weight: 151 lb  (68.5 kg)    Fetal Status: Fetal Heart Rate (bpm): 143   Movement: Present     Physical Exam: BP 116/76   Pulse 94   Wt 151 lb (68.5 kg)   LMP 03/19/2019 (Exact Date)   BMI 25.52 kg/m  CONSTITUTIONAL: Well-developed, well-nourished female in no acute distress.  NEUROLOGIC: Alert and oriented to person, place, and time. Normal reflexes, muscle tone coordination. No cranial nerve deficit noted. PSYCHIATRIC: Normal mood and affect. Normal behavior. Normal judgment and thought content. SKIN: Skin is warm and dry. No rash noted. Not diaphoretic. No erythema. No pallor. HENT:  Normocephalic, atraumatic, External right and left ear normal. Oropharynx is clear and moist EYES: Conjunctivae and EOM are normal. Pupils are equal, round, and reactive to light. No scleral icterus.  NECK: Normal range of motion, supple, no masses CARDIOVASCULAR: Normal heart rate noted, regular rhythm RESPIRATORY: Effort and breath sounds normal, no problems with respiration noted BREASTS: deferred ABDOMEN: Soft, nontender, nondistended, gravid. GU: deferred MUSCULOSKELETAL: Normal range of motion. EXT:  No edema and no tenderness. 2+ distal pulses.  Assessment and Plan:  Pregnancy: G4P3003 at [redacted]w[redacted]d by 2nd trim [place]  1. Supervision of other normal pregnancy, antepartum - Tdap today - return for 3rd trim labs - reviewed options for contraception - reviewed to go to [facility] for any pregnancy related issues  2. Language barrier - Swahili interpretor used  3. 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  Please refer to After Visit Summary for other counseling recommendations.   Return in  about 2 weeks (around 10/18/2019) for high OB, in person.  Conan Bowens 10/04/2019 9:17 AM

## 2019-10-04 NOTE — Addendum Note (Signed)
Addended by: Bethanne Ginger on: 10/04/2019 09:41 AM   Modules accepted: Orders

## 2019-10-04 NOTE — Patient Instructions (Addendum)
Go to the [facility] [place] [facility] at [facility] Address: [address] [place], [place], [place] [address] Phone: [phone]    Use the following websites (and others) to help learn more about your contraception options and find the method that is right for you!  - The Centers for Disease Control (CDC) website: MomentumMarket.be  - Planned Parenthood website: https://www.plannedparenthood.org/learn/birth-control  - Bedsider.org: https://www.bedsider.org/methods    Contraception Choices Contraception, also called birth control, refers to methods or devices that prevent pregnancy. Hormonal methods Contraceptive implant  A contraceptive implant is a thin, plastic tube that contains a hormone. It is inserted into the upper part of the arm. It can remain in place for up to 3 years. Progestin-only injections Progestin-only injections are injections of progestin, a synthetic form of the hormone progesterone. They are given every 3 months by a health care provider. Birth control pills  Birth control pills are pills that contain hormones that prevent pregnancy. They must be taken once a day, preferably at the same time each day. Birth control patch  The birth control patch contains hormones that prevent pregnancy. It is placed on the skin and must be changed once a week for three weeks and removed on the fourth week. A prescription is needed to use this method of contraception. Vaginal ring  A vaginal ring contains hormones that prevent pregnancy. It is placed in the vagina for three weeks and removed on the fourth week. After that, the process is repeated with a new ring. A prescription is needed to use this method of contraception. Emergency contraceptive Emergency contraceptives prevent pregnancy after unprotected sex. They come in pill form and can be taken up to 5 days after sex. They work best the sooner they are taken  after having sex. Most emergency contraceptives are available without a prescription. This method should not be used as your only form of birth control. Barrier methods Female condom  A female condom is a thin sheath that is worn over the penis during sex. Condoms keep sperm from going inside a woman's body. They can be used with a spermicide to increase their effectiveness. They should be disposed after a single use. Female condom  A female condom is a soft, loose-fitting sheath that is put into the vagina before sex. The condom keeps sperm from going inside a woman's body. They should be disposed after a single use. Diaphragm  A diaphragm is a soft, dome-shaped barrier. It is inserted into the vagina before sex, along with a spermicide. The diaphragm blocks sperm from entering the uterus, and the spermicide kills sperm. A diaphragm should be left in the vagina for 6-8 hours after sex and removed within 24 hours. A diaphragm is prescribed and fitted by a health care provider. A diaphragm should be replaced every 1-2 years, after giving birth, after gaining more than 15 lb (6.8 kg), and after pelvic surgery. Cervical cap  A cervical cap is a round, soft latex or plastic cup that fits over the cervix. It is inserted into the vagina before sex, along with spermicide. It blocks sperm from entering the uterus. The cap should be left in place for 6-8 hours after sex and removed within 48 hours. A cervical cap must be prescribed and fitted by a health care provider. It should be replaced every 2 years. Sponge  A sponge is a soft, circular piece of polyurethane foam with spermicide on it. The sponge helps block sperm from entering the uterus, and the spermicide  kills sperm. To use it, you make it wet and then insert it into the vagina. It should be inserted before sex, left in for at least 6 hours after sex, and removed and thrown away within 30 hours. Spermicides Spermicides are chemicals that kill or block  sperm from entering the cervix and uterus. They can come as a cream, jelly, suppository, foam, or tablet. A spermicide should be inserted into the vagina with an applicator at least 10-15 minutes before sex to allow time for it to work. The process must be repeated every time you have sex. Spermicides do not require a prescription. Intrauterine contraception Intrauterine device (IUD) An IUD is a T-shaped device that is put in a woman's uterus. There are two types:  Hormone IUD.This type contains progestin, a synthetic form of the hormone progesterone. This type can stay in place for 3-5 years.  Copper IUD.This type is wrapped in copper wire. It can stay in place for 10 years.  Permanent methods of contraception Female tubal ligation In this method, a woman's fallopian tubes are sealed, tied, or blocked during surgery to prevent eggs from traveling to the uterus. Hysteroscopic sterilization In this method, a small, flexible insert is placed into each fallopian tube. The inserts cause scar tissue to form in the fallopian tubes and block them, so sperm cannot reach an egg. The procedure takes about 3 months to be effective. Another form of birth control must be used during those 3 months. Female sterilization This is a procedure to tie off the tubes that carry sperm (vasectomy). After the procedure, the man can still ejaculate fluid (semen). Natural planning methods Natural family planning In this method, a couple does not have sex on days when the woman could become pregnant. Calendar method This means keeping track of the length of each menstrual cycle, identifying the days when pregnancy can happen, and not having sex on those days. Ovulation method In this method, a couple avoids sex during ovulation. Symptothermal method This method involves not having sex during ovulation. The woman typically checks for ovulation by watching changes in her temperature and in the consistency of cervical  mucus. Post-ovulation method In this method, a couple waits to have sex until after ovulation. Summary  Contraception, also called birth control, means methods or devices that prevent pregnancy.  Hormonal methods of contraception include implants, injections, pills, patches, vaginal rings, and emergency contraceptives.  Barrier methods of contraception can include female condoms, female condoms, diaphragms, cervical caps, sponges, and spermicides.  There are two types of IUDs (intrauterine devices). An IUD can be put in a woman's uterus to prevent pregnancy for 3-5 years.  Permanent sterilization can be done through a procedure for males, females, or both.  Natural family planning methods involve not having sex on days when the woman could become pregnant. This information is not intended to replace advice given to you by your health care provider. Make sure you discuss any questions you have with your health care provider. Document Released: 10/11/2005 Document Revised: 10/13/2017 Document Reviewed: 11/13/2016 Elsevier Patient Education  2020 [employer].

## 2019-10-05 DIAGNOSIS — Z3480 Encounter for supervision of other normal pregnancy, unspecified trimester: Secondary | ICD-10-CM | POA: Diagnosis not present

## 2019-10-08 ENCOUNTER — Other Ambulatory Visit: Payer: Self-pay | Admitting: *Deleted

## 2019-10-08 DIAGNOSIS — Z3480 Encounter for supervision of other normal pregnancy, unspecified trimester: Secondary | ICD-10-CM

## 2019-10-10 ENCOUNTER — Telehealth: Payer: Self-pay

## 2019-10-10 NOTE — Telephone Encounter (Signed)
I have arranged transportation to and from the [facility] office tomorrow Dec 17th. Client will be picked up at 9:17 am.I have informed the patient of the the same. Honor Loh RN BSN PCCN  [phone]

## 2019-10-10 NOTE — Congregational Nurse Program (Signed)
  Dept: [facility] Nurse Program Note  Date of Encounter: 10/03/2019  Past Medical History: Past Medical History:  Diagnosis Date  . Medical history non-contributory     Encounter Details: client reports to be doing well. She is requesting for assistance with transportation to and from the Dr`s office. She has appointment tomorrow at 10am.I will arrange transportation. Honor Loh RN BSN PCCN  [phone]

## 2019-10-11 ENCOUNTER — Other Ambulatory Visit: Payer: Medicaid Other

## 2019-10-11 ENCOUNTER — Other Ambulatory Visit: Payer: Self-pay

## 2019-10-11 DIAGNOSIS — Z3480 Encounter for supervision of other normal pregnancy, unspecified trimester: Secondary | ICD-10-CM | POA: Diagnosis not present

## 2019-10-12 LAB — CBC
Hematocrit: 30.9 % — ABNORMAL LOW (ref 34.0–46.6)
Hemoglobin: 10.5 g/dL — ABNORMAL LOW (ref 11.1–15.9)
MCH: 32.3 pg (ref 26.6–33.0)
MCHC: 34 g/dL (ref 31.5–35.7)
MCV: 95 fL (ref 79–97)
Platelets: 227 10*3/uL (ref 150–450)
RBC: 3.25 x10E6/uL — ABNORMAL LOW (ref 3.77–5.28)
RDW: 12.5 % (ref 11.7–15.4)
WBC: 6.2 10*3/uL (ref 3.4–10.8)

## 2019-10-12 LAB — GLUCOSE TOLERANCE, 2 HOURS W/ 1HR
Glucose, 1 hour: 147 mg/dL (ref 65–179)
Glucose, 2 hour: 134 mg/dL (ref 65–152)
Glucose, Fasting: 82 mg/dL (ref 65–91)

## 2019-10-12 LAB — RPR: RPR Ser Ql: NONREACTIVE

## 2019-10-12 LAB — HIV ANTIBODY (ROUTINE TESTING W REFLEX): HIV Screen 4th Generation wRfx: NONREACTIVE

## 2019-10-15 ENCOUNTER — Other Ambulatory Visit: Payer: Self-pay

## 2019-10-15 ENCOUNTER — Telehealth: Payer: Self-pay | Admitting: Family Medicine

## 2019-10-15 ENCOUNTER — Ambulatory Visit (INDEPENDENT_AMBULATORY_CARE_PROVIDER_SITE_OTHER): Payer: Medicaid Other | Admitting: Family Medicine

## 2019-10-15 VITALS — BP 110/65 | HR 79 | Wt 155.2 lb

## 2019-10-15 DIAGNOSIS — Z3483 Encounter for supervision of other normal pregnancy, third trimester: Secondary | ICD-10-CM

## 2019-10-15 NOTE — Telephone Encounter (Signed)
Per chart review, [facility] to [facility] [place]  There is a note from 12/10 of an initial prenatal visit with Dr. Rosana Hoes.  I have also confirmed this with a staff message to Dr. Owens Shark.  She is also seeing maternal-fetal medicine.  Given that patient is seeing OB/GYN, we can cancel her prenatal visit with [place] today as she will be following up with OB. She has a scheduled appointment on 12/29 with Dr. Nehemiah Settle  Please call patient and cancel appointment on 12/21 with me so she is not seeing 2 providers.  Dalphine Handing, PGY-3 [facility] 10/15/2019 9:34 AM

## 2019-10-15 NOTE — Progress Notes (Signed)
  [name] Date of Birth: November 11, 1990 Date of Visit: 10/15/19 PCP: Martyn Malay, MD Third Trimester Prenatal Visit   Chief Complaint: prenatal care  Subjective: [name] is a pleasant [phone] at  [redacted]w[redacted]d. She has no unusual complaints today.  She denies vaginal bleeding or discharge. She reports good fetal movement. She denies contractions. Per chart review patient has been seeing [facility] for prenatal care, next appointment scheduled on 12/29 with Dr. Nehemiah Settle. Patient reports she only came today because she was told this appointment was scheduled for her, was not sure why she was coming in today.   ROS: per HPI.   See flow sheet for exam.  Fetal heart tones documented in flow sheet  Fundal height documented in flow sheet   A/P: [name] is a pleasant [phone] at [redacted]w[redacted]d presenting for routine third trimester prenatal care. Overall she is doing well. Given that patient is seeing [facility] she will no longer need to have routine prenatal care at [facility]. I explained this to patient. FHT are wnl and patient is feeling good movement and no leakage of fluid or blood. Patient states she would like to continue prenatal care at [facility] and informed of date, time, and location of her next appointment. AVS printed so she could have this information in writing as well.   Strict return precautions given. Follow up with [facility] for continued OB care. No charge for this appointment, dicussed with Dr. Ardelia Mems who agrees with plan.

## 2019-10-22 DIAGNOSIS — Z3480 Encounter for supervision of other normal pregnancy, unspecified trimester: Secondary | ICD-10-CM | POA: Diagnosis not present

## 2019-10-23 ENCOUNTER — Encounter: Payer: Self-pay | Admitting: Family Medicine

## 2019-10-23 ENCOUNTER — Telehealth (INDEPENDENT_AMBULATORY_CARE_PROVIDER_SITE_OTHER): Payer: Medicaid Other | Admitting: Family Medicine

## 2019-10-23 ENCOUNTER — Other Ambulatory Visit: Payer: Self-pay

## 2019-10-23 DIAGNOSIS — O361930 Maternal care for other isoimmunization, third trimester, not applicable or unspecified: Secondary | ICD-10-CM

## 2019-10-23 DIAGNOSIS — Z789 Other specified health status: Secondary | ICD-10-CM

## 2019-10-23 DIAGNOSIS — O99013 Anemia complicating pregnancy, third trimester: Secondary | ICD-10-CM | POA: Diagnosis not present

## 2019-10-23 DIAGNOSIS — Z3A32 32 weeks gestation of pregnancy: Secondary | ICD-10-CM | POA: Diagnosis not present

## 2019-10-23 DIAGNOSIS — O361920 Maternal care for other isoimmunization, second trimester, not applicable or unspecified: Secondary | ICD-10-CM

## 2019-10-23 DIAGNOSIS — Z3480 Encounter for supervision of other normal pregnancy, unspecified trimester: Secondary | ICD-10-CM

## 2019-10-23 MED ORDER — FERROUS GLUCONATE 324 (38 FE) MG PO TABS: 324.0000 mg | ORAL_TABLET | Freq: Every day | ORAL | 3 refills | Status: DC

## 2019-10-23 MED ORDER — PRENATAL VITAMIN 27-0.8 MG PO TABS: 1.0000 | ORAL_TABLET | Freq: Every day | ORAL | 6 refills | Status: DC

## 2019-10-23 NOTE — Progress Notes (Signed)
   TELEHEALTH VIRTUAL OBSTETRICS VISIT ENCOUNTER NOTE  I connected with Katelyn Sharp on 10/23/19 at  8:35 AM EST by telephone at home and verified that I am speaking with the correct person using two identifiers.   I discussed the limitations, risks, security and privacy concerns of performing an evaluation and management service by telephone and the availability of in person appointments. I also discussed with the patient that there may be a patient responsible charge related to this service. The patient expressed understanding and agreed to proceed.  Subjective:  Katelyn Sharp is a 28 y.o. G4P3003 at [redacted]w[redacted]d being followed for ongoing prenatal care.  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  Patient reports no complaints. Reports fetal movement. Denies any contractions, bleeding or leaking of fluid.   The following portions of the patient's history were reviewed and updated as appropriate: allergies, current medications, past family history, past medical history, past social history, past surgical history and problem list.   Objective:   General:  Alert, oriented and cooperative.   Mental Status: Normal mood and affect perceived. Normal judgment and thought content.  Rest of physical exam deferred due to type of encounter  Assessment and Plan:  Pregnancy: G4P3003 at [redacted]w[redacted]d 1. Supervision of other normal pregnancy, antepartum Good fetal movement - Prenatal Vit-Fe Fumarate-FA (PRENATAL VITAMIN) 27-0.8 MG TABS; Take 1 tablet by mouth daily.  Dispense: 30 tablet; Refill: 6  2. Anti-M isoimmunization affecting pregnancy in  second trimester Will need type and crossmatch on admission  3. Language barrier Interpreter used  4. Anemia Iron supplementation  Preterm labor symptoms and general obstetric precautions including but not limited to vaginal bleeding, contractions, leaking of fluid and fetal movement were reviewed in detail with the patient.  I discussed the assessment and treatment plan with the patient. The patient was provided an opportunity to ask questions and all were answered. The patient agreed with the plan and demonstrated an understanding of the instructions. The patient was advised to call back or seek an in-person office evaluation/go to MAU at [facility] for any urgent or concerning symptoms. Please refer to After Visit Summary for other counseling recommendations.   I provided 9 minutes of non-face-to-face time during this encounter.  Return in about 2 weeks (around 11/06/2019) for HR OB f/u, In Office.  No future appointments.  [place] for [employer], [place]

## 2019-10-23 NOTE — Progress Notes (Signed)
I connected with  Katelyn Sharp on 10/23/19 at  8:35 AM EST by telephone and verified that I am speaking with the correct person using two identifiers.   I discussed the limitations, risks, security and privacy concerns of performing an evaluation and management service by telephone and the availability of in person appointments. I also discussed with the patient that there may be a patient responsible charge related to this service. The patient expressed understanding and agreed to proceed.  Derinda Late, RN 10/23/2019  9:06 AM

## 2019-10-26 NOTE — L&D Delivery Note (Signed)
OB/GYN Faculty Practice Delivery Note  Katelyn Sharp is a 29 y.o. [id number] s/p VD at [redacted]w[redacted]d. She was admitted for SOL.   ROM: Just prior to delivery GBS Status: Negative/-- (01/29 1022)  Labor Progress: . Patient presented to L&D for SOL. Initial SVE: 7/90/-1. SROM occurred. She then progressed to complete.   Delivery Date/Time: 2/19 @ 1809 Delivery: Followed patient to room from MAU. SROM occurred in transit and patient was then complete. Head delivered in ROA position with compound right hand. No nuchal cord present. Shoulder and body delivered in usual fashion. Infant with spontaneous cry, placed on mother's abdomen, dried and stimulated. Cord clamped x 2 after 1-minute delay, and cut by grandmother. Cord blood drawn. Placenta delivered spontaneously with gentle cord traction. Fundus firm with massage and Pitocin. Labia, perineum, vagina, and cervix inspected inspected with 1st degree perineal laceration which was repaired with 3-0 Vicryl in a standard fashion.  Baby Weight: pending  Placenta: Sent to L&D Complications: None Lacerations: 1st degree perineal laceration EBL: 120 mL Analgesia: local lidocaine for repair   Infant: APGAR (1 MIN): 9   APGAR (5 MINS): 9   APGAR (10 MINS):     Jerilynn Birkenhead, MD [facility] Fellow, [facility] for [facility], [facility] 12/14/2019, 6:47 PM

## 2019-11-07 ENCOUNTER — Encounter: Payer: Medicaid Other | Admitting: Obstetrics & Gynecology

## 2019-11-08 ENCOUNTER — Other Ambulatory Visit: Payer: Self-pay

## 2019-11-08 ENCOUNTER — Ambulatory Visit (INDEPENDENT_AMBULATORY_CARE_PROVIDER_SITE_OTHER): Payer: Medicaid Other | Admitting: Obstetrics & Gynecology

## 2019-11-08 ENCOUNTER — Telehealth: Payer: Self-pay

## 2019-11-08 VITALS — BP 107/62 | HR 77 | Wt 156.3 lb

## 2019-11-08 DIAGNOSIS — Z3482 Encounter for supervision of other normal pregnancy, second trimester: Secondary | ICD-10-CM

## 2019-11-08 NOTE — Telephone Encounter (Signed)
I have called Center for [facility] at [facility] and confirmed that patient has an appointment scheduled for today at 1555hrs. Patient was unaware. The office did call her but she did not answer. I have called Ms Marteney and informed her of the appointment scheduled for today.She will be there at the scheduled time. Arman Bogus RN BSN PCCN [phone]

## 2019-11-08 NOTE — Patient Instructions (Signed)

## 2019-11-22 ENCOUNTER — Telehealth: Payer: Self-pay

## 2019-11-22 NOTE — Telephone Encounter (Signed)
Ms Katelyn Sharp requested transportation to and from [employer] office. Transport arrangement done with [employer]. I have informed [name] that she will be picked up at 07:45 am on 11/23/19. Arman Bogus RN BSN PCCN [phone]

## 2019-11-23 ENCOUNTER — Other Ambulatory Visit: Payer: Self-pay

## 2019-11-23 ENCOUNTER — Other Ambulatory Visit (HOSPITAL_COMMUNITY)
Admission: RE | Admit: 2019-11-23 | Discharge: 2019-11-23 | Disposition: A | Discharge status: Home / Self Care | Payer: Medicaid Other | Source: Ambulatory Visit | Attending: Family Medicine | Admitting: Family Medicine

## 2019-11-23 ENCOUNTER — Ambulatory Visit (INDEPENDENT_AMBULATORY_CARE_PROVIDER_SITE_OTHER): Payer: Medicaid Other | Admitting: Family Medicine

## 2019-11-23 VITALS — BP 129/77 | HR 75 | Wt 159.2 lb

## 2019-11-23 DIAGNOSIS — O361920 Maternal care for other isoimmunization, second trimester, not applicable or unspecified: Secondary | ICD-10-CM

## 2019-11-23 DIAGNOSIS — Z3480 Encounter for supervision of other normal pregnancy, unspecified trimester: Secondary | ICD-10-CM

## 2019-11-23 DIAGNOSIS — O361930 Maternal care for other isoimmunization, third trimester, not applicable or unspecified: Secondary | ICD-10-CM

## 2019-11-23 DIAGNOSIS — Z789 Other specified health status: Secondary | ICD-10-CM

## 2019-11-23 DIAGNOSIS — Z3A36 36 weeks gestation of pregnancy: Secondary | ICD-10-CM

## 2019-11-23 DIAGNOSIS — Z758 Other problems related to medical facilities and other health care: Secondary | ICD-10-CM

## 2019-11-23 DIAGNOSIS — O99013 Anemia complicating pregnancy, third trimester: Secondary | ICD-10-CM

## 2019-11-23 NOTE — Patient Instructions (Signed)

## 2019-11-23 NOTE — Progress Notes (Signed)
   PRENATAL VISIT NOTE  Subjective:  Katelyn Sharp is a 29 y.o. G4P3003 at [redacted]w[redacted]d being seen today for ongoing prenatal care.  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  Patient reports no complaints.  Contractions: Not present. Vag. Bleeding: None.  Movement: Present. Denies leaking of fluid.   The following portions of the patient's history were reviewed and updated as appropriate: allergies, current medications, past family history, past medical history, past social history, past surgical history and problem list.   Objective:   Vitals:   11/23/19 0829  BP: 129/77  Pulse: 75  Weight: 159 lb 3.2 oz (72.2 kg)    Fetal Status: Fetal Heart Rate (bpm): 146 Fundal Height: 35 cm Movement: Present  Presentation: Vertex  General:  Alert, oriented and cooperative. Patient is in no acute distress.  Skin: Skin is warm and dry. No rash noted.   Cardiovascular: Normal heart rate noted  Respiratory: Normal respiratory effort, no problems with respiration noted  Abdomen: Soft, gravid, appropriate for gestational age.  Pain/Pressure: Absent     Pelvic: Cervical exam performed Dilation: 1.5 Effacement (%): Thick Station: Ballotable  Extremities: Normal range of motion.  Edema: None  Mental Status: Normal mood and affect. Normal behavior. Normal judgment and thought content.   Assessment and Plan:  Pregnancy: G4P3003 at [redacted]w[redacted]d 1. Supervision of other normal pregnancy, antepartum Cultures today - GC/Chlamydia probe amp ([place])not at [facility] - Culture, beta strep (group b only)  2. 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 Anemia during pregnancy in third trimester On Iron  4. Language barrier Video  and in person interpreter used.  Preterm labor symptoms and general obstetric precautions including but not limited to vaginal bleeding, contractions, leaking of fluid and fetal movement were reviewed in detail with the patient. Please refer to After Visit Summary for other counseling recommendations.   Return in 1 week (on 11/30/2019) for virtual.  Future Appointments  Date Time Provider Department Center  11/30/2019  8:35 AM Conan Bowens, MD [facility]  12/07/2019  9:35 AM Adam Phenix, MD [facility]  12/14/2019  9:15 AM Federico Flake, MD [facility]    Reva Bores, MD

## 2019-11-26 LAB — GC/CHLAMYDIA PROBE AMP (~~LOC~~) NOT AT ARMC
Chlamydia: NEGATIVE
Comment: NEGATIVE
Comment: NORMAL
Neisseria Gonorrhea: NEGATIVE

## 2019-11-27 LAB — CULTURE, BETA STREP (GROUP B ONLY): Strep Gp B Culture: NEGATIVE

## 2019-11-30 ENCOUNTER — Ambulatory Visit (INDEPENDENT_AMBULATORY_CARE_PROVIDER_SITE_OTHER): Payer: Medicaid Other | Admitting: Obstetrics and Gynecology

## 2019-11-30 ENCOUNTER — Other Ambulatory Visit: Payer: Self-pay

## 2019-11-30 VITALS — BP 135/71 | HR 63

## 2019-11-30 DIAGNOSIS — O361920 Maternal care for other isoimmunization, second trimester, not applicable or unspecified: Secondary | ICD-10-CM

## 2019-11-30 DIAGNOSIS — Z789 Other specified health status: Secondary | ICD-10-CM

## 2019-11-30 DIAGNOSIS — Z3A37 37 weeks gestation of pregnancy: Secondary | ICD-10-CM

## 2019-11-30 DIAGNOSIS — O99013 Anemia complicating pregnancy, third trimester: Secondary | ICD-10-CM

## 2019-11-30 DIAGNOSIS — Z3480 Encounter for supervision of other normal pregnancy, unspecified trimester: Secondary | ICD-10-CM

## 2019-11-30 DIAGNOSIS — O361930 Maternal care for other isoimmunization, third trimester, not applicable or unspecified: Secondary | ICD-10-CM

## 2019-11-30 NOTE — Progress Notes (Signed)
I connected with  Katelyn Sharp on 11/30/19 at  8:35 AM EST by telephone with [facility] interpreter Joash ID [phone] and verified that I am speaking with the correct person using two identifiers.   I discussed the limitations, risks, security and privacy concerns of performing an evaluation and management service by telephone and the availability of in person appointments. I also discussed with the patient that there may be a patient responsible charge related to this service. The patient expressed understanding and agreed to proceed.  [employer] interpreter Onalee Hua ID [phone] used for provider portion of visit.  Marjo Bicker, RN 11/30/2019  8:35 AM

## 2019-11-30 NOTE — Progress Notes (Signed)
   TELEHEALTH OBSTETRICS PRENATAL VIRTUAL VIDEO VISIT ENCOUNTER NOTE  Provider location: Center for [employer] at [place]   I connected with Mort Sawyers on 11/30/19 at  8:35 AM EST by MyChart Video Encounter at home and verified that I am speaking with the correct person using two identifiers.   I discussed the limitations, risks, security and privacy concerns of performing an evaluation and management service virtually and the availability of in person appointments. I also discussed with the patient that there may be a patient responsible charge related to this service. The patient expressed understanding and agreed to proceed. Subjective:  Adaora Mchaney is a 29 y.o. G4P3003 at [redacted]w[redacted]d being seen today for ongoing prenatal care.  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  Patient reports no complaints.  Contractions: Not present. Vag. Bleeding: None.  Movement: Present. Denies any leaking of fluid.   The following portions of the patient's history were reviewed and updated as appropriate: allergies, current medications, past family history, past medical history, past social history, past surgical history and problem list.   Objective:   Vitals:   11/30/19 0837  BP: 135/71  Pulse: 63   Fetal Status:     Movement: Present     General:  Alert, oriented and cooperative. Patient is in no acute distress.  Respiratory: Normal respiratory effort, no problems with respiration noted  Mental Status: Normal mood and affect. Normal behavior. Normal judgment and thought content.  Rest of physical exam deferred due to type of encounter  Imaging: No results found.  Assessment and Plan:  Pregnancy: G4P3003 at [redacted]w[redacted]d  1. Language barrier Swahili  interpretor used  2. 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 Supervision of other normal pregnancy, antepartum Reviewed hospital policies regarding visitors, masking, COVID testing, etc.  4. Anemia during pregnancy in third trimester   Term labor symptoms and general obstetric precautions including but not limited to vaginal bleeding, contractions, leaking of fluid and fetal movement were reviewed in detail with the patient. I discussed the assessment and treatment plan with the patient. The patient was provided an opportunity to ask questions and all were answered. The patient agreed with the plan and demonstrated an understanding of the instructions. The patient was advised to call back or seek an in-person office evaluation/go to MAU at [facility] for any urgent or concerning symptoms. Please refer to After Visit Summary for other counseling recommendations.   I provided 15 minutes of face-to-face time during this encounter.  Return in about 1 week (around 12/07/2019) for high OB, virtual.  Future Appointments  Date Time Provider Department Center  12/07/2019  9:35 AM Adam Phenix, MD [facility]  12/14/2019  9:15 AM Federico Flake, MD [facility]   Conan Bowens, MD Center for [facility], [facility]

## 2019-12-07 ENCOUNTER — Encounter: Payer: Self-pay | Admitting: Medical

## 2019-12-07 ENCOUNTER — Other Ambulatory Visit: Payer: Self-pay

## 2019-12-07 ENCOUNTER — Telehealth (INDEPENDENT_AMBULATORY_CARE_PROVIDER_SITE_OTHER): Payer: Medicaid Other | Admitting: Medical

## 2019-12-07 VITALS — BP 136/76 | HR 65

## 2019-12-07 DIAGNOSIS — Z3480 Encounter for supervision of other normal pregnancy, unspecified trimester: Secondary | ICD-10-CM

## 2019-12-07 DIAGNOSIS — Z603 Acculturation difficulty: Secondary | ICD-10-CM

## 2019-12-07 DIAGNOSIS — N898 Other specified noninflammatory disorders of vagina: Secondary | ICD-10-CM

## 2019-12-07 DIAGNOSIS — Z789 Other specified health status: Secondary | ICD-10-CM

## 2019-12-07 DIAGNOSIS — O99013 Anemia complicating pregnancy, third trimester: Secondary | ICD-10-CM | POA: Diagnosis not present

## 2019-12-07 DIAGNOSIS — O361930 Maternal care for other isoimmunization, third trimester, not applicable or unspecified: Secondary | ICD-10-CM

## 2019-12-07 DIAGNOSIS — O361920 Maternal care for other isoimmunization, second trimester, not applicable or unspecified: Secondary | ICD-10-CM

## 2019-12-07 MED ORDER — TERCONAZOLE 0.8 % VA CREA: 1.0000 | TOPICAL_CREAM | Freq: Every day | VAGINAL | 0 refills | Status: DC

## 2019-12-07 NOTE — Patient Instructions (Addendum)
Fetal Movement Counts Patient Name: ________________________________________________ Patient Due Date: ____________________ What is a fetal movement count?  A fetal movement count is the number of times that you feel your baby move during a certain amount of time. This may also be called a fetal kick count. A fetal movement count is recommended for every pregnant woman. You may be asked to start counting fetal movements as early as week 28 of your pregnancy. Pay attention to when your baby is most active. You may notice your baby's sleep and wake cycles. You may also notice things that make your baby move more. You should do a fetal movement count:  When your baby is normally most active.  At the same time each day. A good time to count movements is while you are resting, after having something to eat and drink. How do I count fetal movements? 1. Find a quiet, comfortable area. Sit, or lie down on your side. 2. Write down the date, the start time and stop time, and the number of movements that you felt between those two times. Take this information with you to your health care visits. 3. Write down your start time when you feel the first movement. 4. Count kicks, flutters, swishes, rolls, and jabs. You should feel at least 10 movements. 5. You may stop counting after you have felt 10 movements, or if you have been counting for 2 hours. Write down the stop time. 6. If you do not feel 10 movements in 2 hours, contact your health care provider for further instructions. Your health care provider may want to do additional tests to assess your baby's well-being. Contact a health care provider if:  You feel fewer than 10 movements in 2 hours.  Your baby is not moving like he or she usually does. Date: ____________ Start time: ____________ Stop time: ____________ Movements: ____________ Date: ____________ Start time: ____________ Stop time: ____________ Movements: ____________ Date: ____________  Start time: ____________ Stop time: ____________ Movements: ____________ Date: ____________ Start time: ____________ Stop time: ____________ Movements: ____________ Date: ____________ Start time: ____________ Stop time: ____________ Movements: ____________ Date: ____________ Start time: ____________ Stop time: ____________ Movements: ____________ Date: ____________ Start time: ____________ Stop time: ____________ Movements: ____________ Date: ____________ Start time: ____________ Stop time: ____________ Movements: ____________ Date: ____________ Start time: ____________ Stop time: ____________ Movements: ____________ This information is not intended to replace advice given to you by your health care provider. Make sure you discuss any questions you have with your health care provider. Document Revised: 05/31/2019 Document Reviewed: 05/31/2019 Elsevier Patient Education  2020 [employer] Braxton Hicks Contractions Contractions of the uterus can occur throughout pregnancy, but they are not always a sign that you are in labor. You may have practice contractions called Braxton Hicks contractions. These false labor contractions are sometimes confused with true labor. What are Braxton Hicks contractions? Braxton Hicks contractions are tightening movements that occur in the muscles of the uterus before labor. Unlike true labor contractions, these contractions do not result in opening (dilation) and thinning of the cervix. Toward the end of pregnancy (32-34 weeks), Braxton Hicks contractions can happen more often and may become stronger. These contractions are sometimes difficult to tell apart from true labor because they can be very uncomfortable. You should not feel embarrassed if you go to the hospital with false labor. Sometimes, the only way to tell if you are in true labor is for your health care provider to look for changes in the cervix. The health care provider   will do a physical exam and may  monitor your contractions. If you are not in true labor, the exam should show that your cervix is not dilating and your water has not broken. If there are no other health problems associated with your pregnancy, it is completely safe for you to be sent home with false labor. You may continue to have Braxton Hicks contractions until you go into true labor. How to tell the difference between true labor and false labor True labor  Contractions last 30-70 seconds.  Contractions become very regular.  Discomfort is usually felt in the top of the uterus, and it spreads to the lower abdomen and low back.  Contractions do not go away with walking.  Contractions usually become more intense and increase in frequency.  The cervix dilates and gets thinner. False labor  Contractions are usually shorter and not as strong as true labor contractions.  Contractions are usually irregular.  Contractions are often felt in the front of the lower abdomen and in the groin.  Contractions may go away when you walk around or change positions while lying down.  Contractions get weaker and are shorter-lasting as time goes on.  The cervix usually does not dilate or become thin. Follow these instructions at home:   Take over-the-counter and prescription medicines only as told by your health care provider.  Keep up with your usual exercises and follow other instructions from your health care provider.  Eat and drink lightly if you think you are going into labor.  If Braxton Hicks contractions are making you uncomfortable: ? Change your position from lying down or resting to walking, or change from walking to resting. ? Sit and rest in a tub of warm water. ? Drink enough fluid to keep your urine pale yellow. Dehydration may cause these contractions. ? Do slow and deep breathing several times an hour.  Keep all follow-up prenatal visits as told by your health care provider. This is important. Contact a  health care provider if:  You have a fever.  You have continuous pain in your abdomen. Get help right away if:  Your contractions become stronger, more regular, and closer together.  You have fluid leaking or gushing from your vagina.  You pass blood-tinged mucus (bloody show).  You have bleeding from your vagina.  You have low back pain that you never had before.  You feel your baby's head pushing down and causing pelvic pressure.  Your baby is not moving inside you as much as it used to. Summary  Contractions that occur before labor are called Braxton Hicks contractions, false labor, or practice contractions.  Braxton Hicks contractions are usually shorter, weaker, farther apart, and less regular than true labor contractions. True labor contractions usually become progressively stronger and regular, and they become more frequent.  Manage discomfort from [facility] contractions by changing position, resting in a warm bath, drinking plenty of water, or practicing deep breathing. This information is not intended to replace advice given to you by your health care provider. Make sure you discuss any questions you have with your health care provider. Document Revised: 09/23/2017 Document Reviewed: 02/24/2017 Elsevier Patient Education  2020 [employer].  Contraception Choices Contraception, also called birth control, means things to use or ways to try not to get pregnant. Hormonal birth control This kind of birth control uses hormones. Here are some types of hormonal birth control:  A tube that is put under skin of the arm (implant). The tube  can stay in for as long as 3 years.  Shots to get every 3 months (injections).  Pills to take every day (birth control pills).  A patch to change 1 time each week for 3 weeks (birth control patch). After that, the patch is taken off for 1 week.  A ring to put in the vagina. The ring is left in for 3 weeks. Then it is taken out of the  vagina for 1 week. Then a new ring is put in.  Pills to take after unprotected sex (emergency birth control pills). Barrier birth control Here are some types of barrier birth control:  A thin covering that is put on the penis before sex (female condom). The covering is thrown away after sex.  A soft, loose covering that is put in the vagina before sex (female condom). The covering is thrown away after sex.  A rubber bowl that sits over the cervix (diaphragm). The bowl must be made for you. The bowl is put into the vagina before sex. The bowl is left in for 6-8 hours after sex. It is taken out within 24 hours.  A small, soft cup that fits over the cervix (cervical cap). The cup must be made for you. The cup can be left in for 6-8 hours after sex. It is taken out within 48 hours.  A sponge that is put into the vagina before sex. It must be left in for at least 6 hours after sex. It must be taken out within 30 hours. Then it is thrown away.  A chemical that kills or stops sperm from getting into the uterus (spermicide). It may be a pill, cream, jelly, or foam to put in the vagina. The chemical should be used at least 10-15 minutes before sex. IUD (intrauterine) birth control An IUD is a small, T-shaped piece of plastic. It is put inside the uterus. There are two kinds:  Hormone IUD. This kind can stay in for 3-5 years.  Copper IUD. This kind can stay in for 10 years. Permanent birth control Here are some types of permanent birth control:  Surgery to block the fallopian tubes.  Having an insert put into each fallopian tube.  Surgery to tie off the tubes that carry sperm (vasectomy). Natural planning birth control Here are some types of natural planning birth control:  Not having sex on the days the woman could get pregnant.  Using a calendar: ? To keep track of the length of each period. ? To find out what days pregnancy can happen. ? To plan to not have sex on days when pregnancy  can happen.  Watching for symptoms of ovulation and not having sex during ovulation. One way the woman can check for ovulation is to check her temperature.  Waiting to have sex until after ovulation. Summary  Contraception, also called birth control, means things to use or ways to try not to get pregnant.  Hormonal methods of birth control include implants, injections, pills, patches, vaginal rings, and emergency birth control pills.  Barrier methods of birth control can include female condoms, female condoms, diaphragms, cervical caps, sponges, and spermicides.  There are two types of IUD (intrauterine device) birth control. An IUD can be put in a woman's uterus to prevent pregnancy for 3-5 years.  Permanent sterilization can be done through a procedure for males, females, or both.  Natural planning methods involve not having sex on the days when the woman could get pregnant. This information is not  intended to replace advice given to you by your health care provider. Make sure you discuss any questions you have with your health care provider. Document Revised: 01/31/2019 Document Reviewed: 10/21/2016 Elsevier Patient Education  [place] to have your son circumcised:                                                                      [facility]     [phone] while you are in hospital         [facility]              [phone]   $269 by 4 wks                      [facility]                     [phone]   $269 by 7 days [facility]                    [phone]   $269 by 4 wks [facility]             [phone]   $225 by 2 wks    These prices sometimes change but are roughly what you can expect to pay. Please call and confirm pricing.   Circumcision is considered an elective/non-medically necessary procedure. There are many reasons parents decide to have their sons circumsized. During the first year of life circumcised males have a reduced risk of urinary tract infections  but after this year the rates between circumcised males and uncircumcised males are the same.  It is safe to have your son circumcised outside of the hospital and the places above perform them regularly.   Deciding about Circumcision in Baby Boys  (Up-to-date The Basics)  What is circumcision?   Circumcision is a surgery that removes the skin that covers the tip of the penis, called the "foreskin" Circumcision is usually done when a boy is between 23 and 40 days old. In the [place], circumcision is common. In some other countries, fewer boys are circumcised. Circumcision is a common tradition in some religions.  Should I have my baby boy circumcised?   There is no easy answer. Circumcision has some benefits. But it also has risks. After talking with your doctor, you will have to decide for yourself what is right for your family.  What are the benefits of circumcision?   Circumcised boys seem to have slightly lower rates of: ?Urinary tract infections ?Swelling of the opening at the tip of the penis Circumcised men seem to have slightly lower rates of: ?Urinary tract infections ?Swelling of the opening at the tip of the penis ?Penis cancer ?HIV and other infections that you catch during sex ?Cervical cancer in the women they have sex with Even so, in the [place], the risks of these problems are small - even in boys and men who have not been circumcised. Plus, boys and men who are not circumcised can reduce these extra risks by: ?Cleaning their penis well ?Using condoms during sex  What are the risks of circumcision?  Risks include: ?Bleeding or infection from the surgery ?Damage to  or amputation of the penis ?A chance that the doctor will cut off too much or not enough of the foreskin ?A chance that sex won't feel as good later in life Only about 1 out of every 200 circumcisions leads to problems. There is also a chance that your health insurance won't pay for  circumcision.  How is circumcision done in baby boys?  First, the baby gets medicine for pain relief. This might be a cream on the skin or a shot into the base of the penis. Next, the doctor cleans the baby's penis well. Then he or she uses special tools to cut off the foreskin. Finally, the doctor wraps a bandage (called gauze) around the baby's penis. If you have your baby circumcised, his doctor or nurse will give you instructions on how to care for him after the surgery. It is important that you follow those instructions carefully.

## 2019-12-07 NOTE — Progress Notes (Signed)
I connected with  Mort Sawyers on 12/07/19 at  9:35 AM EST by telephone and verified that I am speaking with the correct person using two identifiers.   I discussed the limitations, risks, security and privacy concerns of performing an evaluation and management service by telephone and the availability of in person appointments. I also discussed with the patient that there may be a patient responsible charge related to this service. The patient expressed understanding and agreed to proceed.  Henrietta Dine, CMA 12/07/2019  9:59 AM

## 2019-12-07 NOTE — Progress Notes (Signed)
I connected with Mort Sawyers on 12/07/19 at  9:35 AM EST by: MyChart and verified that I am speaking with the correct person using two identifiers.  Patient is located at home and provider is located at [facility].     The purpose of this virtual visit is to provide medical care while limiting exposure to the novel coronavirus. I discussed the limitations, risks, security and privacy concerns of performing an evaluation and management service by MyChart and the availability of in person appointments. I also discussed with the patient that there may be a patient responsible charge related to this service. By engaging in this virtual visit, you consent to the provision of healthcare.  Additionally, you authorize for your insurance to be billed for the services provided during this visit.  The patient expressed understanding and agreed to proceed.  The following staff members participated in the virtual visit:  Corinda Gubler, CMA    PRENATAL VISIT NOTE  Subjective:  Katelyn Sharp is a 29 y.o. G4P3003 at [redacted]w[redacted]d  for phone visit for ongoing prenatal care.  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  Patient reports vaginal itching and discharge.  Contractions: Not present. Vag. Bleeding: None.  Movement: Present. Denies leaking of fluid.   The following portions of the patient's history were reviewed and updated as appropriate: allergies, current medications, past family history, past medical history, past social history, past surgical history and problem list.   Objective:   Vitals:   12/07/19 1003  BP: 136/76  Pulse: 65   Self-Obtained  Fetal Status:     Movement: Present     Assessment and Plan:  Pregnancy: G4P3003 at [redacted]w[redacted]d 1. Supervision of other  normal pregnancy, antepartum  2. Language barrier - Interpreter used   3. Anemia during pregnancy in third trimester - Hgb 10.5 at last check on 12/17  4. 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 Vaginal itching - terconazole (TERAZOL 3) 0.8 % vaginal cream; Place 1 applicator vaginally at bedtime.  Dispense: 20 g; Refill: 0  Term labor symptoms and general obstetric precautions including but not limited to vaginal bleeding, contractions, leaking of fluid and fetal movement were reviewed in detail with the patient.  Return in about 1 week (around 12/14/2019) for [facility], In-Person.  Future Appointments  Date Time Provider Department Center  12/14/2019  9:15 AM Federico Flake, MD [facility]     Time spent on virtual visit: 15 minutes  Vonzella Nipple, PA-C

## 2019-12-14 ENCOUNTER — Inpatient Hospital Stay (HOSPITAL_COMMUNITY)
Admission: AD | Admit: 2019-12-14 | Discharge: 2019-12-16 | DRG: 807 | Disposition: A | Discharge status: Home / Self Care | Payer: Medicaid Other | Attending: Obstetrics & Gynecology | Admitting: Obstetrics & Gynecology

## 2019-12-14 ENCOUNTER — Encounter: Payer: Medicaid Other | Admitting: Family Medicine

## 2019-12-14 DIAGNOSIS — O26893 Other specified pregnancy related conditions, third trimester: Secondary | ICD-10-CM | POA: Diagnosis present

## 2019-12-14 DIAGNOSIS — D649 Anemia, unspecified: Secondary | ICD-10-CM | POA: Diagnosis present

## 2019-12-14 DIAGNOSIS — O361910 Maternal care for other isoimmunization, first trimester, not applicable or unspecified: Secondary | ICD-10-CM | POA: Diagnosis present

## 2019-12-14 DIAGNOSIS — Z20822 Contact with and (suspected) exposure to covid-19: Secondary | ICD-10-CM | POA: Diagnosis present

## 2019-12-14 DIAGNOSIS — Z603 Acculturation difficulty: Secondary | ICD-10-CM | POA: Diagnosis present

## 2019-12-14 DIAGNOSIS — Z3A39 39 weeks gestation of pregnancy: Secondary | ICD-10-CM | POA: Diagnosis not present

## 2019-12-14 DIAGNOSIS — O479 False labor, unspecified: Secondary | ICD-10-CM | POA: Diagnosis not present

## 2019-12-14 DIAGNOSIS — O361930 Maternal care for other isoimmunization, third trimester, not applicable or unspecified: Principal | ICD-10-CM | POA: Diagnosis present

## 2019-12-14 DIAGNOSIS — Z789 Other specified health status: Secondary | ICD-10-CM | POA: Diagnosis present

## 2019-12-14 DIAGNOSIS — O9902 Anemia complicating childbirth: Secondary | ICD-10-CM | POA: Diagnosis present

## 2019-12-14 DIAGNOSIS — O99013 Anemia complicating pregnancy, third trimester: Secondary | ICD-10-CM | POA: Diagnosis present

## 2019-12-14 DIAGNOSIS — O361920 Maternal care for other isoimmunization, second trimester, not applicable or unspecified: Secondary | ICD-10-CM | POA: Diagnosis present

## 2019-12-14 LAB — CBC
HCT: 39.1 % (ref 36.0–46.0)
Hemoglobin: 12.2 g/dL (ref 12.0–15.0)
MCH: 31 pg (ref 26.0–34.0)
MCHC: 31.2 g/dL (ref 30.0–36.0)
MCV: 99.2 fL (ref 80.0–100.0)
Platelets: 231 10*3/uL (ref 150–400)
RBC: 3.94 MIL/uL (ref 3.87–5.11)
RDW: 13.7 % (ref 11.5–15.5)
WBC: 8.4 10*3/uL (ref 4.0–10.5)
nRBC: 0 % (ref 0.0–0.2)

## 2019-12-14 LAB — RESPIRATORY PANEL BY RT PCR (FLU A&B, COVID)
Influenza A by PCR: NEGATIVE
Influenza B by PCR: NEGATIVE
SARS Coronavirus 2 by RT PCR: NEGATIVE

## 2019-12-14 MED ORDER — OXYTOCIN BOLUS FROM INFUSION: 500.0000 mL | Freq: Once | INTRAVENOUS | Status: AC

## 2019-12-14 MED ORDER — LACTATED RINGERS IV SOLN: 500.0000 mL | INTRAVENOUS | Status: DC | PRN

## 2019-12-14 MED ORDER — IBUPROFEN 600 MG PO TABS
600.0000 mg | ORAL_TABLET | Freq: Three times a day (TID) | ORAL | Status: DC | PRN
  Administered 2019-12-15 – 2019-12-16 (×4): 600 mg via ORAL
  Filled 2019-12-14 (×4): qty 1

## 2019-12-14 MED ORDER — SOD CITRATE-CITRIC ACID 500-334 MG/5ML PO SOLN: 30.0000 mL | ORAL | Status: DC | PRN

## 2019-12-14 MED ORDER — ACETAMINOPHEN 325 MG PO TABS
650.0000 mg | ORAL_TABLET | Freq: Four times a day (QID) | ORAL | Status: DC | PRN
  Administered 2019-12-16 (×2): 650 mg via ORAL
  Filled 2019-12-14 (×3): qty 2

## 2019-12-14 MED ORDER — ACETAMINOPHEN 325 MG PO TABS: 650.0000 mg | ORAL_TABLET | ORAL | Status: DC | PRN

## 2019-12-14 MED ORDER — DIPHENHYDRAMINE HCL 25 MG PO CAPS: 25.0000 mg | ORAL_CAPSULE | Freq: Four times a day (QID) | ORAL | Status: DC | PRN

## 2019-12-14 MED ORDER — SENNOSIDES-DOCUSATE SODIUM 8.6-50 MG PO TABS
2.0000 | ORAL_TABLET | ORAL | Status: DC
  Administered 2019-12-14 – 2019-12-16 (×2): 2 via ORAL
  Filled 2019-12-14 (×2): qty 2

## 2019-12-14 MED ORDER — BENZOCAINE-MENTHOL 20-0.5 % EX AERO: 1.0000 "application " | INHALATION_SPRAY | CUTANEOUS | Status: DC | PRN

## 2019-12-14 MED ORDER — ONDANSETRON HCL 4 MG PO TABS: 4.0000 mg | ORAL_TABLET | ORAL | Status: DC | PRN

## 2019-12-14 MED ORDER — MEASLES, MUMPS & RUBELLA VAC IJ SOLR: 0.5000 mL | Freq: Once | INTRAMUSCULAR | Status: DC

## 2019-12-14 MED ORDER — COCONUT OIL OIL: 1.0000 "application " | TOPICAL_OIL | Status: DC | PRN

## 2019-12-14 MED ORDER — LACTATED RINGERS IV SOLN: INTRAVENOUS | Status: DC

## 2019-12-14 MED ORDER — OXYTOCIN 40 UNITS IN NORMAL SALINE INFUSION - SIMPLE MED: 2.5000 [IU]/h | INTRAVENOUS | Status: DC

## 2019-12-14 MED ORDER — ONDANSETRON HCL 4 MG/2ML IJ SOLN: 4.0000 mg | Freq: Four times a day (QID) | INTRAMUSCULAR | Status: DC | PRN

## 2019-12-14 MED ORDER — ONDANSETRON HCL 4 MG/2ML IJ SOLN: 4.0000 mg | INTRAMUSCULAR | Status: DC | PRN

## 2019-12-14 MED ORDER — DIBUCAINE (PERIANAL) 1 % EX OINT: 1.0000 "application " | TOPICAL_OINTMENT | CUTANEOUS | Status: DC | PRN

## 2019-12-14 MED ORDER — TETANUS-DIPHTH-ACELL PERTUSSIS 5-2.5-18.5 LF-MCG/0.5 IM SUSP: 0.5000 mL | Freq: Once | INTRAMUSCULAR | Status: DC

## 2019-12-14 MED ORDER — WITCH HAZEL-GLYCERIN EX PADS: 1.0000 "application " | MEDICATED_PAD | CUTANEOUS | Status: DC | PRN

## 2019-12-14 MED ORDER — SIMETHICONE 80 MG PO CHEW: 80.0000 mg | CHEWABLE_TABLET | ORAL | Status: DC | PRN

## 2019-12-14 MED ORDER — OXYTOCIN 40 UNITS IN NORMAL SALINE INFUSION - SIMPLE MED
INTRAVENOUS | Status: AC
  Administered 2019-12-14: 500 mL via INTRAVENOUS
  Filled 2019-12-14: qty 1000

## 2019-12-14 MED ORDER — LIDOCAINE HCL (PF) 1 % IJ SOLN
30.0000 mL | INTRAMUSCULAR | Status: AC | PRN
  Administered 2019-12-14: 30 mL via SUBCUTANEOUS
  Filled 2019-12-14: qty 30

## 2019-12-14 MED ORDER — PRENATAL MULTIVITAMIN CH
1.0000 | ORAL_TABLET | Freq: Every day | ORAL | Status: DC
  Administered 2019-12-16: 1 via ORAL
  Filled 2019-12-14: qty 1

## 2019-12-14 NOTE — H&P (Signed)
OBSTETRIC ADMISSION HISTORY AND PHYSICAL  Katelyn Sharp is a 29 y.o. female [phone] with IUP at [redacted]w[redacted]d by 2nd trimester [place] presenting for SOL. She reports +FMs, No LOF, no VB, no blurry vision, headaches or peripheral edema, and RUQ pain.  She plans on breast feeding. She is undecided for birth control. She received her prenatal care at [facility] then [facility]   Dating: By 21 wk [place]  --->  Estimated Date of Delivery: 12/16/19  Sono: 11/18   @[redacted]w[redacted]d , CWD, normal anatomy, cephalic presentation, anterior placental lie, 953g, 44% EFW  Prenatal History/Complications: Anti-M isoimmunization affecting pregnancy  Language barrier  Past Medical History: Past Medical History:  Diagnosis Date  . Medical history non-contributory     Past Surgical History: Past Surgical History:  Procedure Laterality Date  . PERINEAL LACERATION REPAIR     G1 pregnancy     Obstetrical History: OB History    Gravida  4   Para  3   Term  3   Preterm  0   AB  0   Living  3     SAB      TAB      Ectopic      Multiple      Live Births           Obstetric Comments  G1- 2014- SVD of boy, 2.5 kg- had episiotomy in G1   G2- 2016- [name], SVD, girl, 2.5 kg  G3- [name]- 2018- SVD, 2.5 kg         Social History: Social History   Socioeconomic History  . Marital status: Married    Spouse name: Not on file  . Number of children: Not on file  . Years of education: Not on file  . Highest education level: Not on file  Occupational History  . Not on file  Tobacco Use  . Smoking status: Never Smoker  . Smokeless tobacco: Never Used  Substance and Sexual Activity  . Alcohol use: Never  . Drug use: Never  . Sexual activity: Yes    Partners: Male    Birth control/protection: None  Other Topics Concern  . Not on file  Social History Narrative   Refugee Information   Number of Immediate Family Members: 4   Number of Immediate Family Members in [place]: 4   Date of Arrival: 06/28/19   Country of Birth:  [place] of Origin: [facility]   Location of [place]: [place]   Duration in [place]: 20 years or greater   Reason for Stella: Merchant navy officer   Primary Language: Swahili/Kiswahili   Able to Read in Primary Language: Yes   Able to Write in Primary Language: Yes   Education: [employer]   Prior Work: Worked at home   Marital Status: Married   Sexual Activity: Yes   Tuberculosis Screening Health Department: Not Completed   Health Department Labs Completed: No(obtaining today)   History of Trauma: None   Do You Feel Jumpy or Nervous?: No   Are You Very Watchful or 'Super Alert'?: No   Social Determinants of Health   Financial Resource Strain:   . Difficulty of Paying Living Expenses: Not on file  Food Insecurity:   . Worried About Charity fundraiser in the Last Year: Not on file  . Ran Out of Food in the Last Year: Not on file  Transportation Needs:   . Lack of Transportation (Medical): Not on file  . Lack of Transportation (Non-Medical): Not  on file  Physical Activity:   . Days of Exercise per Week: Not on file  . Minutes of Exercise per Session: Not on file  Stress:   . Feeling of Stress : Not on file  Social Connections:   . Frequency of Communication with Friends and Family: Not on file  . Frequency of Social Gatherings with Friends and Family: Not on file  . Attends Religious Services: Not on file  . Active Member of Clubs or Organizations: Not on file  . Attends Banker Meetings: Not on file  . Marital Status: Not on file    Family History: Family History  Problem Relation Age of Onset  . Bleeding Disorder Neg Hx   . Hypertension Neg Hx   . Diabetes Neg Hx     Allergies: No Known Allergies  Medications Prior to Admission  Medication Sig Dispense Refill Last Dose  . Blood Pressure Monitoring DEVI 1 Device by Does not apply route once a week. 1 Device 0   . ferrous gluconate (FERGON) 324 MG tablet Take 1 tablet (324 mg total) by  mouth daily with breakfast. 60 tablet 3   . Prenatal Vit-Fe Fumarate-FA (PRENATAL VITAMIN) 27-0.8 MG TABS Take 1 tablet by mouth daily. 30 tablet 6   . terconazole (TERAZOL 3) 0.8 % vaginal cream Place 1 applicator vaginally at bedtime. 20 g 0      Review of Systems   All systems reviewed and negative except as stated in HPI  Last menstrual period 03/19/2019. General appearance: alert, cooperative and appears stated age Lungs: normal effort Heart: regular rate  Abdomen: soft, non-tender; bowel sounds normal Pelvic: gravid uterus GU: No vaginal lesions  Extremities: Homans sign is negative, no sign of DVT Presentation: cephalic Fetal monitoringBaseline: 145 bpm, Variability: Good {> 6 bpm), Accelerations: Reactive and Decelerations: Absent Uterine activity: Frequency: Every 2 minutes Dilation: Lip/rim Effacement (%): 80 Exam by:: Dr. Morene Antu   Prenatal labs: ABO, Rh: O/Positive/-- (10/06 1748) Antibody: POS (11/10 1537) Rubella: >33.00 (10/06 1748) RPR: Non Reactive (12/17 1014)  HBsAg: Negative (10/06 1748)  HIV: Non Reactive (12/17 1014)  GBS: Negative/-- (01/29 1022)  2 hr Glucola WNL Genetic screening  declined Anatomy US WNL  Prenatal Transfer Tool  Maternal Diabetes: No Genetic Screening: Declined Maternal Ultrasounds/Referrals: Normal Fetal Ultrasounds or other Referrals:  None Maternal Substance Abuse:  No Significant Maternal Medications:  None Significant Maternal Lab Results: Group B Strep negative  Results for orders placed or performed during the hospital encounter of 12/14/19 (from the past 24 hour(s))  CBC   Collection Time: 12/14/19  5:58 PM  Result Value Ref Range   WBC 8.4 4.0 - 10.5 K/uL   RBC 3.94 3.87 - 5.11 MIL/uL   Hemoglobin 12.2 12.0 - 15.0 g/dL   HCT 31.5 17.6 - 16.0 %   MCV 99.2 80.0 - 100.0 fL   MCH 31.0 26.0 - 34.0 pg   MCHC 31.2 30.0 - 36.0 g/dL   RDW 73.7 10.6 - 26.9 %   Platelets 231 150 - 400 K/uL   nRBC 0.0 0.0 - 0.2 %     Patient Active Problem List   Diagnosis Date Noted  . Anemia during pregnancy in third trimester 10/23/2019  . Language barrier 10/04/2019  . Supervision of other normal pregnancy, antepartum 10/04/2019  . 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 G4P3003 at [redacted]w[redacted]d here for SOL.  #Labor: Vertex by exam.  Expectant management vs AROM. Anticipate SVD. #Pain: Per patient request #FWB: Cat I; EFW: 3400g #ID:  GBS neg #MOF: Breast #MOC: undecided  #Circ:  Outpatient #Anti-M isoimmunization affecting pregnancy: blood bank notified and preparing blood in case needed  Jerilynn Birkenhead, MD [facility] Family Medicine Fellow, [facility] for [facility], [facility] 12/14/2019, 6:25 PM

## 2019-12-14 NOTE — Discharge Summary (Addendum)
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 yo [id number] 316w5das admitted in Active Labor on 12/14/2019. Patient had an uncomplicated labor course as follows: Patient presented to L&D for SOL. Initial SVE: 7/90/-1. SROM occurred. She then progressed to complete. Membrane Rupture Time/Date: 5:59 PM ,12/14/2019   Intrapartum Procedures: Episiotomy: None [1]                                         Lacerations:  1st degree [2]  Patient had a delivery of a Viable infant. 12/14/2019  Information for the patient's newborn:  AzMaday, Guarino0[[id number]]Delivery Method: Vaginal, Spontaneous(Filed from Delivery Summary)     Pateint had an uncomplicated postpartum course.  She is ambulating, tolerating a regular diet, passing flatus, and urinating well. Patient is discharged home in stable condition on 12/16/19.  Delivery time: 6:09 PM    Magnesium Sulfate received: No BMZ received: No Rhophylac:No MMR:No Transfusion:No  Physical exam  Vitals:   12/15/19 1115 12/15/19 1532 12/15/19 2036 12/16/19 0412  BP: 127/71 130/74 131/87 (!) 130/92  Pulse:  63 68 67  Resp:  '20 18 19  ' Temp: 97.7  F (36.5 C) 97.7 F (36.5 C) 98.4 F (36.9 C) 98.5 F (36.9 C)  TempSrc:  Oral Oral Oral  SpO2:  100%     General: alert Lochia: appropriate Uterine Fundus: firm Incision: Healing well with no significant drainage, N/A DVT Evaluation: No evidence of DVT seen on physical exam. Labs: Lab Results  Component Value Date   WBC 8.4 12/14/2019   HGB 12.2 12/14/2019   HCT 39.1 12/14/2019   MCV 99.2 12/14/2019   PLT 231 12/14/2019   No flowsheet data found. Edinburgh Score: Edinburgh Postnatal Depression Scale Screening Tool 12/15/2019  I have been able to laugh and see the funny side of things. 0  I have looked forward with enjoyment to things. 0  I have blamed myself unnecessarily when things went wrong. 0  I have been anxious or worried for no good reason. 0  I have felt scared or panicky for no good reason. 0  Things have been getting on top  of me. 0  I have been so unhappy that I have had difficulty sleeping. 0  I have felt sad or miserable. 0  I have been so unhappy that I have been crying. 0  The thought of harming myself has occurred to me. 0  Edinburgh Postnatal Depression Scale Total 0    Discharge instruction: per After Visit Summary and "Baby and Me Booklet".  After visit meds:  Allergies as of 12/16/2019   No Known Allergies     Medication List    STOP taking these medications   terconazole 0.8 % vaginal cream Commonly known as: TERAZOL 3     TAKE these medications   amLODipine 5 MG tablet Commonly known as: NORVASC Take 1 tablet (5 mg total) by mouth daily.   Blood Pressure Monitoring Devi 1 Device by Does not apply route once a week.   ferrous gluconate 324 MG tablet Commonly known as: FERGON Take 1 tablet (324 mg total) by mouth daily with breakfast.   ibuprofen 600 MG tablet Commonly known as: ADVIL Take 1 tablet (600 mg total) by mouth every 8 (eight) hours as needed for mild pain.   Prenatal Vitamin 27-0.8 MG Tabs Take 1 tablet by mouth  daily.       Diet: routine diet  Activity: Advance as tolerated. Pelvic rest for 6 weeks.   Outpatient follow up:4 weeks Follow up Appt:No future appointments. Follow up Visit: [place] for [facility] Follow up.   Specialty: Obstetrics and Gynecology Contact information: [place] [address], [place] [id number] [place] [id number] [phone]           Please schedule this patient for Postpartum visit in: 4 weeks with the following provider: Any provider Virtual For C/S patients schedule nurse incision check in weeks 2 weeks: no Low risk pregnancy complicated by: none Delivery mode:  SVD Anticipated Birth Control:  POPs- DEPO PP Procedures needed: none  Schedule Integrated BH visit: no   Newborn Data: Live born female  Birth Weight: 3650 g APGAR: 60, 9  Newborn Delivery   Birth date/time: 12/14/2019 18:09:00 Delivery type:       Baby Feeding: Breast Disposition:home with mother  # message sent to [facility], elevated BP at discharge, Norvasc 5 mg started. She needs BP check this week. Labile BP's during stay, with 3 elevated > 140/90. Asymptomatic patient.    Lezlie Lye, NP 12/16/2019 12:33 PM

## 2019-12-15 LAB — RPR: RPR Ser Ql: NONREACTIVE

## 2019-12-15 NOTE — Progress Notes (Addendum)
Patient ID: Katelyn Sharp, female   DOB: 1991-08-08, 29 y.o.   MRN: [id number]  POSTPARTUM PROGRESS NOTE - [facility] Language Line Video Swahili Interpreter [phone]  Post Partum Day 1  Subjective:  Katelyn Sharp is a 29 y.o. C2E8337 s/p SVD at 104w6d.  No acute events overnight.  Pt denies problems with ambulating, voiding or po intake.  She denies nausea or vomiting.  Pain is well controlled.  She has had flatus. She has had bowel movement.  Lochia Small.   Objective: Blood pressure 131/87, pulse 68, temperature 98.4 F (36.9 C), temperature source Oral, resp. rate 18, last menstrual period 03/19/2019, SpO2 100 %.  Physical Exam:  General: alert, cooperative and no distress Chest: no respiratory distress Heart:regular rate, distal pulses intact Abdomen: soft, nontender,  Uterine Fundus: firm, appropriately tender DVT Evaluation: No calf swelling or tenderness Extremities: No edema Skin: warm, dry  Recent Labs    12/14/19 1758  HGB 12.2  HCT 39.1    Assessment/Plan: Katelyn Sharp is a 29 y.o. O4Z1460 s/p SVD at [redacted]w[redacted]d   PPD#1 - Doing well Contraception: undecided Feeding: breast Dispo: Plan for discharge tomorrow.   LOS: 1 day   Raelyn Mora, CNM 12/15/2019, 12:30 PM

## 2019-12-15 NOTE — Lactation Note (Signed)
This note was copied from a baby's chart. Lactation Consultation Note  [name] [id number] Date: 12/15/2019 Reason for consult: Initial assessment;Term  P3 mother whose infant is now 60 hours old.  This is a term baby.  Mother breast fed her other children for 1 1/2 years each.  The youngest one is 29 years old.  IPad Swahili interpreter used for interpretation.  RN had the interpreter services in use when I arrived.    Mother had no questions/concerns related to breast feeding.  Offered to assist with latching and mother accepted.  Much education provided related to positioning of mother, pillow support, how to latch effectively, breast compressions and feeding cues.  Mother was unable to express colostrum drops prior to latching.  Suggested mother try the cross cradle hold and she was willing to try this as opposed to the cradle hold.  Mother's breasts are soft and non tender and nipples are everted and intact.  Assisted baby to latch easily in the cross cradle hold on the left breast.  Encouraged STS for all feedings.  Demonstrated breast compressions.  Mother needed many reminders on hand/finger placement.  She wants to make an "air hole" for baby to breathe and hold him far away from the breast tissue.  Repeatedly asked her to latch him deeper and explained that he could easily breathe.  He remained calm through the adjustments made.  Mother will continue to require follow up on positioning.  Observed him actively feeding for 10 minutes prior to my departure.  Mother denied pain.  She will continue feeding 8-12 times/24 hours or sooner if baby shows feeding cues.  Reviewed cues.  She will call for latch assistance as needed.  Mother afraid she cannot "see" any "milk" coming out.  Education provided regarding this.  Her mother in law is her support person in the hospital.    Mother does not have or desire a DEBP for home use.  Informed her that we will provided a hand pump on the  day of discharge.  RN back in room at the end of my visit to provide pain medication for mother.  She will call for assistance as needed.   Maternal Data Formula Feeding for Exclusion: Yes Reason for exclusion: Mother's choice to formula feed on admision Has patient been taught Hand Expression?: Yes Does the patient have breastfeeding experience prior to this delivery?: Yes  Feeding Feeding Type: Breast Fed  LATCH Score Latch: Grasps breast easily, tongue down, lips flanged, rhythmical sucking.  Audible Swallowing: A few with stimulation  Type of Nipple: Everted at rest and after stimulation  Comfort (Breast/Nipple): Soft / non-tender  Hold (Positioning): Assistance needed to correctly position infant at breast and maintain latch.  LATCH Score: 8  Interventions Interventions: Breast feeding basics reviewed;Assisted with latch;Skin to skin;Breast massage;Hand express;Breast compression;Adjust position;Position options;Support pillows  Lactation Tools Discussed/Used     Consult Status Consult Status: Follow-up Date: 12/16/19 Follow-up type: In-patient    Zilah Villaflor R Karen Kinnard 12/15/2019, 12:04 PM

## 2019-12-16 MED ORDER — LIDOCAINE HCL (PF) 1 % IJ SOLN
INTRAMUSCULAR | Status: AC
  Filled 2019-12-16: qty 5

## 2019-12-16 MED ORDER — MEDROXYPROGESTERONE ACETATE 150 MG/ML IM SUSP
150.0000 mg | Freq: Once | INTRAMUSCULAR | Status: AC
  Administered 2019-12-16: 150 mg via INTRAMUSCULAR
  Filled 2019-12-16: qty 1

## 2019-12-16 MED ORDER — AMLODIPINE BESYLATE 5 MG PO TABS
5.0000 mg | ORAL_TABLET | Freq: Every day | ORAL | Status: DC
  Administered 2019-12-16: 5 mg via ORAL
  Filled 2019-12-16: qty 1

## 2019-12-16 MED ORDER — IBUPROFEN 600 MG PO TABS: 600.0000 mg | ORAL_TABLET | Freq: Three times a day (TID) | ORAL | 0 refills | Status: DC | PRN

## 2019-12-16 MED ORDER — AMLODIPINE BESYLATE 5 MG PO TABS: 5.0000 mg | ORAL_TABLET | Freq: Every day | ORAL | 1 refills | Status: DC

## 2019-12-16 NOTE — Lactation Note (Signed)
This note was copied from a baby's chart. Lactation Consultation Note  [name] [id number] Date: 12/16/2019 Reason for consult: Follow-up assessment  P3 mother whose infant is now 5 hours old.  This is a term baby.  Mother breast fed her other children for 1 1/2 years each.  Mother's feeding preference is breast/bottle.  Swahili interpreter [phone] used for interpretation.    Baby was finishing breast feeding when I arrived.  Mother had no questions related to breast feeding but had questions about formula.  She had a pack of Gerber GoodStart at bedside.  Questions answered, however, I strongly encouraged her to breast feed first prior to any supplementation and informed her that she really does not need to use any formula unless she feels like baby is not feeding well.  Baby's weight loss is 4% today.  Mother's breasts are soft and filling now per mother.  Nipples are everted and intact.  Coconut oil provided for use at home.    Offered a manual pump for home use but mother politely declined.  She does not have a DEBP and does not wish to obtain one.  Mother in law is her support person present.  Mother has our OP phone number for questions after discharge.   Maternal Data    Feeding Feeding Type: Breast Fed  LATCH Score Latch: Grasps breast easily, tongue down, lips flanged, rhythmical sucking.  Audible Swallowing: A few with stimulation  Type of Nipple: Everted at rest and after stimulation  Comfort (Breast/Nipple): Soft / non-tender  Hold (Positioning): No assistance needed to correctly position infant at breast.  LATCH Score: 9  Interventions    Lactation Tools Discussed/Used     Consult Status Consult Status: Complete Date: 12/16/19 Follow-up type: Call as needed    Shaunta Oncale R Ronika Kelson 12/16/2019, 10:03 AM

## 2019-12-16 NOTE — Plan of Care (Signed)
  Problem: Education: Goal: Knowledge of condition will improve Note: [place] interpreter [phone] used for morning assessment and questions. Used interpreter to order patient's breakfast. Katelyn Sharp

## 2019-12-16 NOTE — Discharge Instructions (Signed)

## 2019-12-16 NOTE — Plan of Care (Signed)
  Problem: Education: Goal: Knowledge of condition will improve 12/16/2019 1351 by Karn Cassis, RN Note: Discharge education and follow up reviewed with patient using [facility] interpreter "[place]" [phone]. Patient's questions answered. Earl Gala, Linda Hedges [place]

## 2019-12-17 ENCOUNTER — Telehealth: Payer: Self-pay | Admitting: Obstetrics and Gynecology

## 2019-12-17 ENCOUNTER — Telehealth: Payer: Self-pay | Admitting: Family Medicine

## 2019-12-17 ENCOUNTER — Encounter (HOSPITAL_COMMUNITY): Payer: Self-pay | Admitting: Obstetrics & Gynecology

## 2019-12-17 LAB — TYPE AND SCREEN
ABO/RH(D): O POS
Antibody Screen: POSITIVE
Donor AG Type: NEGATIVE
Donor AG Type: NEGATIVE
Unit division: 0
Unit division: 0

## 2019-12-17 LAB — BPAM RBC
Blood Product Expiration Date: 202102282359
Blood Product Expiration Date: 202102282359
Unit Type and Rh: 5100
Unit Type and Rh: 5100

## 2019-12-17 NOTE — Telephone Encounter (Signed)
Opened in error

## 2019-12-17 NOTE — Telephone Encounter (Signed)
-----   Message from Duane Lope, NP sent at 12/16/2019 12:51 PM EST ----- Regarding: Needs BP check PP patient, dc'd home on Sunday. Started on Norvasc 5 mg daily. Will need BP check in the office this week.   Thank you,   Victorino Dike

## 2019-12-17 NOTE — Telephone Encounter (Signed)
Called the patient to inform of the upcoming appointment. A relative answered Katelyn Sharp) he stated the patient can be reached at [phone]. I informed the Katelyn Sharp I will call the patient at the number he provided however if he could also please ask the patient to give our office a call back.   The number the relative provided has a voicemail in spanish. A voicemail was not left on the line.  Mailing an appointment reminder letter.

## 2019-12-17 NOTE — Telephone Encounter (Signed)
Called patient with interpreter [phone], and left a detailed message about her appointment.

## 2019-12-20 ENCOUNTER — Ambulatory Visit: Payer: Medicaid Other

## 2019-12-21 ENCOUNTER — Ambulatory Visit: Payer: Medicaid Other

## 2019-12-24 ENCOUNTER — Telehealth: Payer: Self-pay

## 2019-12-24 NOTE — Telephone Encounter (Signed)
I have called patient accounting on behalf of Katelyn Sharp to provide medicaid number Arman Bogus RN BSN PCCN [phone]

## 2019-12-25 ENCOUNTER — Telehealth: Payer: Self-pay | Admitting: Obstetrics and Gynecology

## 2019-12-25 ENCOUNTER — Telehealth: Payer: Self-pay

## 2019-12-25 NOTE — Telephone Encounter (Signed)
Called the patient with to reschedule the missed appointment. The patient stated she can complete the visit on Friday. The patient verbalized understanding.  Interpreter id [phone]

## 2019-12-25 NOTE — Telephone Encounter (Signed)
Clydie Braun Holstein called in from [facility] of [place]. She stated she is working with the patient to assist with care. She stated the patient has not been taking the blood pressure medication or ibuprofen as they are being delivered today.   She also stated she has questions about current mediation. Should the patient continue to take the prenatal and iron tablets?  She would like a call back to discuss the information with a nurse.  Office number is [phone]

## 2019-12-26 NOTE — Telephone Encounter (Addendum)
Called Clydie Braun and she informed me that she had spoken to the pt and was informed that the pt had not picked up the Norvasc 5 mg tablet because she was not aware that she needed to take it.  Clydie Braun states that the pt wanted to know if she should continue to take her PNV and her iron tablet.  Per chart review pt can stop the iron and PNV.  Pt also had a BP check appt scheduled and pt no showed.  I informed Clydie Braun that we would reschedule the pt for her BP check.  Clydie Braun advised me that she has informed pt to go and pick up her medication for BP and begin taking it.  Pt has been scheduled for BP check on 12/28/19.    Addison Naegeli, RN

## 2019-12-28 ENCOUNTER — Ambulatory Visit: Payer: Medicaid Other | Admitting: *Deleted

## 2019-12-28 ENCOUNTER — Other Ambulatory Visit: Payer: Self-pay

## 2019-12-28 DIAGNOSIS — O165 Unspecified maternal hypertension, complicating the puerperium: Secondary | ICD-10-CM

## 2019-12-28 DIAGNOSIS — I10 Essential (primary) hypertension: Secondary | ICD-10-CM | POA: Insufficient documentation

## 2019-12-28 NOTE — Progress Notes (Signed)
Agree with A & P. 

## 2019-12-28 NOTE — Progress Notes (Signed)
Here for bp check s/p vaginal delivery . Was ordered norvasc at discharge for elevated bp on day of d/c. She has not been taking the norvasc because she didn't understand. She is breastfeeding. She denies headaches or edema. She brought all of her meds with her and said she was told to bring with her and we would explain what she should take. I explained she should take her PNV and iron every day . She should take ibuprofen if she has pain and may have 1 every 8 hours as needed.  I discussed with Dr.Ervin her history, bp and assessment today. I explained to her she should not take the norvasc.  She required repeated explanations until she stated she understood. I had her mark her bottle of ibuprofen as pain med to help her not get confused and to tape over lid of norvasc.  I instructed her to keep just in case she needed it after her pp visit.  Joanthan Hlavacek,RN

## 2020-01-11 ENCOUNTER — Telehealth: Payer: Self-pay

## 2020-01-11 NOTE — Telephone Encounter (Signed)
I have called [facility] patient accounting on behalf of Katelyn Sharp to verify medicaid information.  Arman Bogus RN BSN PCCN [phone]

## 2020-01-14 ENCOUNTER — Telehealth: Payer: Self-pay

## 2020-01-14 ENCOUNTER — Encounter: Payer: Self-pay | Admitting: Student

## 2020-01-14 ENCOUNTER — Other Ambulatory Visit: Payer: Self-pay

## 2020-01-14 ENCOUNTER — Ambulatory Visit (INDEPENDENT_AMBULATORY_CARE_PROVIDER_SITE_OTHER): Payer: Medicaid Other | Admitting: Student

## 2020-01-14 DIAGNOSIS — Z789 Other specified health status: Secondary | ICD-10-CM

## 2020-01-14 DIAGNOSIS — Z603 Acculturation difficulty: Secondary | ICD-10-CM

## 2020-01-14 DIAGNOSIS — O165 Unspecified maternal hypertension, complicating the puerperium: Secondary | ICD-10-CM

## 2020-01-14 NOTE — Telephone Encounter (Signed)
Called Pt with Katelyn Sharp id # [phone]  to advise appointment for PCP @ [facility] w/ Dr Manson Passey on 02/01/20 @ 10:30 to manage Hypertension,no answer, left VM.

## 2020-01-14 NOTE — Patient Instructions (Signed)
Hypertension, Adult High blood pressure (hypertension) is when the force of blood pumping through the arteries is too strong. The arteries are the blood vessels that carry blood from the heart throughout the body. Hypertension forces the heart to work harder to pump blood and may cause arteries to become narrow or stiff. Untreated or uncontrolled hypertension can cause a heart attack, heart failure, a stroke, kidney disease, and other problems. A blood pressure reading consists of a higher number over a lower number. Ideally, your blood pressure should be below 120/80. The first ("top") number is called the systolic pressure. It is a measure of the pressure in your arteries as your heart beats. The second ("bottom") number is called the diastolic pressure. It is a measure of the pressure in your arteries as the heart relaxes. What are the causes? The exact cause of this condition is not known. There are some conditions that result in or are related to high blood pressure. What increases the risk? Some risk factors for high blood pressure are under your control. The following factors may make you more likely to develop this condition:  Smoking.  Having type 2 diabetes mellitus, high cholesterol, or both.  Not getting enough exercise or physical activity.  Being overweight.  Having too much fat, sugar, calories, or salt (sodium) in your diet.  Drinking too much alcohol. Some risk factors for high blood pressure may be difficult or impossible to change. Some of these factors include:  Having chronic kidney disease.  Having a family history of high blood pressure.  Age. Risk increases with age.  Race. You may be at higher risk if you are African American.  Gender. Men are at higher risk than women before age 45. After age 65, women are at higher risk than men.  Having obstructive sleep apnea.  Stress. What are the signs or symptoms? High blood pressure may not cause symptoms. Very high  blood pressure (hypertensive crisis) may cause:  Headache.  Anxiety.  Shortness of breath.  Nosebleed.  Nausea and vomiting.  Vision changes.  Severe chest pain.  Seizures. How is this diagnosed? This condition is diagnosed by measuring your blood pressure while you are seated, with your arm resting on a flat surface, your legs uncrossed, and your feet flat on the floor. The cuff of the blood pressure monitor will be placed directly against the skin of your upper arm at the level of your heart. It should be measured at least twice using the same arm. Certain conditions can cause a difference in blood pressure between your right and left arms. Certain factors can cause blood pressure readings to be lower or higher than normal for a short period of time:  When your blood pressure is higher when you are in a health care provider's office than when you are at home, this is called white coat hypertension. Most people with this condition do not need medicines.  When your blood pressure is higher at home than when you are in a health care provider's office, this is called masked hypertension. Most people with this condition may need medicines to control blood pressure. If you have a high blood pressure reading during one visit or you have normal blood pressure with other risk factors, you may be asked to:  Return on a different day to have your blood pressure checked again.  Monitor your blood pressure at home for 1 week or longer. If you are diagnosed with hypertension, you may have other blood or   imaging tests to help your health care provider understand your overall risk for other conditions. How is this treated? This condition is treated by making healthy lifestyle changes, such as eating healthy foods, exercising more, and reducing your alcohol intake. Your health care provider may prescribe medicine if lifestyle changes are not enough to get your blood pressure under control, and  if:  Your systolic blood pressure is above 130.  Your diastolic blood pressure is above 80. Your personal target blood pressure may vary depending on your medical conditions, your age, and other factors. Follow these instructions at home: Eating and drinking   Eat a diet that is high in fiber and potassium, and low in sodium, added sugar, and fat. An example eating plan is called the DASH (Dietary Approaches to Stop Hypertension) diet. To eat this way: ? Eat plenty of fresh fruits and vegetables. Try to fill one half of your plate at each meal with fruits and vegetables. ? Eat whole grains, such as whole-wheat pasta, brown rice, or whole-grain bread. Fill about one fourth of your plate with whole grains. ? Eat or drink low-fat dairy products, such as skim milk or low-fat yogurt. ? Avoid fatty cuts of meat, processed or cured meats, and poultry with skin. Fill about one fourth of your plate with lean proteins, such as fish, chicken without skin, beans, eggs, or tofu. ? Avoid pre-made and processed foods. These tend to be higher in sodium, added sugar, and fat.  Reduce your daily sodium intake. Most people with hypertension should eat less than 1,500 mg of sodium a day.  Do not drink alcohol if: ? Your health care provider tells you not to drink. ? You are pregnant, may be pregnant, or are planning to become pregnant.  If you drink alcohol: ? Limit how much you use to:  0-1 drink a day for women.  0-2 drinks a day for men. ? Be aware of how much alcohol is in your drink. In the [place] one drink equals one 12 oz bottle of beer (355 mL), one 5 oz glass of wine (148 mL), or one 1 oz glass of hard liquor (44 mL). Lifestyle   Work with your health care provider to maintain a healthy body weight or to lose weight. Ask what an ideal weight is for you.  Get at least 30 minutes of exercise most days of the week. Activities may include walking, swimming, or biking.  Include exercise to  strengthen your muscles (resistance exercise), such as Pilates or lifting weights, as part of your weekly exercise routine. Try to do these types of exercises for 30 minutes at least 3 days a week.  Do not use any products that contain nicotine or tobacco, such as cigarettes, e-cigarettes, and chewing tobacco. If you need help quitting, ask your health care provider.  Monitor your blood pressure at home as told by your health care provider.  Keep all follow-up visits as told by your health care provider. This is important. Medicines  Take over-the-counter and prescription medicines only as told by your health care provider. Follow directions carefully. Blood pressure medicines must be taken as prescribed.  Do not skip doses of blood pressure medicine. Doing this puts you at risk for problems and can make the medicine less effective.  Ask your health care provider about side effects or reactions to medicines that you should watch for. Contact a health care provider if you:  Think you are having a reaction to a medicine you   are taking.  Have headaches that keep coming back (recurring).  Feel dizzy.  Have swelling in your ankles.  Have trouble with your vision. Get help right away if you:  Develop a severe headache or confusion.  Have unusual weakness or numbness.  Feel faint.  Have severe pain in your chest or abdomen.  Vomit repeatedly.  Have trouble breathing. Summary  Hypertension is when the force of blood pumping through your arteries is too strong. If this condition is not controlled, it may put you at risk for serious complications.  Your personal target blood pressure may vary depending on your medical conditions, your age, and other factors. For most people, a normal blood pressure is less than 120/80.  Hypertension is treated with lifestyle changes, medicines, or a combination of both. Lifestyle changes include losing weight, eating a healthy, low-sodium diet,  exercising more, and limiting alcohol. This information is not intended to replace advice given to you by your health care provider. Make sure you discuss any questions you have with your health care provider. Document Revised: 06/21/2018 Document Reviewed: 06/21/2018 Elsevier Patient Education  2020 [employer]. Contraception Choices Contraception, also called birth control, refers to methods or devices that prevent pregnancy. Hormonal methods Contraceptive implant  A contraceptive implant is a thin, plastic tube that contains a hormone. It is inserted into the upper part of the arm. It can remain in place for up to 3 years. Progestin-only injections Progestin-only injections are injections of progestin, a synthetic form of the hormone progesterone. They are given every 3 months by a health care provider. Birth control pills  Birth control pills are pills that contain hormones that prevent pregnancy. They must be taken once a day, preferably at the same time each day. Birth control patch  The birth control patch contains hormones that prevent pregnancy. It is placed on the skin and must be changed once a week for three weeks and removed on the fourth week. A prescription is needed to use this method of contraception. Vaginal ring  A vaginal ring contains hormones that prevent pregnancy. It is placed in the vagina for three weeks and removed on the fourth week. After that, the process is repeated with a new ring. A prescription is needed to use this method of contraception. Emergency contraceptive Emergency contraceptives prevent pregnancy after unprotected sex. They come in pill form and can be taken up to 5 days after sex. They work best the sooner they are taken after having sex. Most emergency contraceptives are available without a prescription. This method should not be used as your only form of birth control. Barrier methods Female condom  A female condom is a thin sheath that is worn  over the penis during sex. Condoms keep sperm from going inside a woman's body. They can be used with a spermicide to increase their effectiveness. They should be disposed after a single use. Female condom  A female condom is a soft, loose-fitting sheath that is put into the vagina before sex. The condom keeps sperm from going inside a woman's body. They should be disposed after a single use. Diaphragm  A diaphragm is a soft, dome-shaped barrier. It is inserted into the vagina before sex, along with a spermicide. The diaphragm blocks sperm from entering the uterus, and the spermicide kills sperm. A diaphragm should be left in the vagina for 6-8 hours after sex and removed within 24 hours. A diaphragm is prescribed and fitted by a health care provider. A diaphragm should  be replaced every 1-2 years, after giving birth, after gaining more than 15 lb (6.8 kg), and after pelvic surgery. Cervical cap  A cervical cap is a round, soft latex or plastic cup that fits over the cervix. It is inserted into the vagina before sex, along with spermicide. It blocks sperm from entering the uterus. The cap should be left in place for 6-8 hours after sex and removed within 48 hours. A cervical cap must be prescribed and fitted by a health care provider. It should be replaced every 2 years. Sponge  A sponge is a soft, circular piece of polyurethane foam with spermicide on it. The sponge helps block sperm from entering the uterus, and the spermicide kills sperm. To use it, you make it wet and then insert it into the vagina. It should be inserted before sex, left in for at least 6 hours after sex, and removed and thrown away within 30 hours. Spermicides Spermicides are chemicals that kill or block sperm from entering the cervix and uterus. They can come as a cream, jelly, suppository, foam, or tablet. A spermicide should be inserted into the vagina with an applicator at least 10-15 minutes before sex to allow time for it  to work. The process must be repeated every time you have sex. Spermicides do not require a prescription. Intrauterine contraception Intrauterine device (IUD) An IUD is a T-shaped device that is put in a woman's uterus. There are two types:  Hormone IUD.This type contains progestin, a synthetic form of the hormone progesterone. This type can stay in place for 3-5 years.  Copper IUD.This type is wrapped in copper wire. It can stay in place for 10 years.  Permanent methods of contraception Female tubal ligation In this method, a woman's fallopian tubes are sealed, tied, or blocked during surgery to prevent eggs from traveling to the uterus. Hysteroscopic sterilization In this method, a small, flexible insert is placed into each fallopian tube. The inserts cause scar tissue to form in the fallopian tubes and block them, so sperm cannot reach an egg. The procedure takes about 3 months to be effective. Another form of birth control must be used during those 3 months. Female sterilization This is a procedure to tie off the tubes that carry sperm (vasectomy). After the procedure, the man can still ejaculate fluid (semen). Natural planning methods Natural family planning In this method, a couple does not have sex on days when the woman could become pregnant. Calendar method This means keeping track of the length of each menstrual cycle, identifying the days when pregnancy can happen, and not having sex on those days. Ovulation method In this method, a couple avoids sex during ovulation. Symptothermal method This method involves not having sex during ovulation. The woman typically checks for ovulation by watching changes in her temperature and in the consistency of cervical mucus. Post-ovulation method In this method, a couple waits to have sex until after ovulation. Summary  Contraception, also called birth control, means methods or devices that prevent pregnancy.  Hormonal methods of  contraception include implants, injections, pills, patches, vaginal rings, and emergency contraceptives.  Barrier methods of contraception can include female condoms, female condoms, diaphragms, cervical caps, sponges, and spermicides.  There are two types of IUDs (intrauterine devices). An IUD can be put in a woman's uterus to prevent pregnancy for 3-5 years.  Permanent sterilization can be done through a procedure for males, females, or both.  Natural family planning methods involve not having sex on days  when the woman could become pregnant. This information is not intended to replace advice given to you by your health care provider. Make sure you discuss any questions you have with your health care provider. Document Revised: 10/13/2017 Document Reviewed: 11/13/2016 Elsevier Patient Education  2020 [employer].

## 2020-01-14 NOTE — Progress Notes (Signed)
Appointment made with patients PCP Terisa Starr 02/01/20 @ 10:30a.

## 2020-01-14 NOTE — Progress Notes (Signed)
Subjective:     Katelyn Sharp is a 29 y.o. female who presents for a postpartum visit. She is 4 weeks postpartum following a spontaneous vaginal delivery. I have fully reviewed the prenatal and intrapartum course. The delivery was at 39/5 gestational weeks. Outcome: spontaneous vaginal delivery. Anesthesia: local. Postpartum course has been normal. Baby's course has been normal. Baby is feeding by both breast and bottle - Carnation Good Start. Bleeding thin lochia. Bowel function is normal. Bladder function is normal. Patient is not sexually active. Contraception method is Depo-Provera injections. Postpartum depression screening: negative.  The following portions of the patient's history were reviewed and updated as appropriate: allergies, current medications, past family history, past medical history, past social history, past surgical history and problem list.  Review of Systems Pertinent items are noted in HPI.   Objective:    BP (!) 144/81   Pulse 66   Wt 157 lb 3.2 oz (71.3 kg)   LMP 03/19/2019 (Exact Date)   BMI 26.57 kg/m   General:  alert and cooperative  Lungs: clear to auscultation bilaterally  Heart:  regular rate and rhythm, S1, S2 normal, no murmur, click, rub or gallop  Abdomen: soft, non-tender; bowel sounds normal; no masses,  no organomegaly   Vulva:  not evaluated  Vagina: not evaluated        Assessment:     Normal postpartum exam. Pap smear not done at today's visit.   Plan:   1. Encounter for routine postpartum follow-up -was supposed to be virtual but patient came to office -doing well -smear up to date -will schedule for next depo injection  2. Language barrier -Swahili video interpreter used for this visit  3. Postpartum hypertension -patient instructed to start norvasc as previously prescribed. Called her PCP can got her a follow up appointment for BP management  Judeth Horn, NP

## 2020-01-21 ENCOUNTER — Telehealth: Payer: Medicaid Other | Admitting: Nurse Practitioner

## 2020-01-23 ENCOUNTER — Other Ambulatory Visit: Payer: Self-pay

## 2020-01-23 ENCOUNTER — Telehealth: Payer: Self-pay

## 2020-01-23 NOTE — Telephone Encounter (Signed)
I have called Family medicine and confirmed that Katelyn Sharp has an appointment scheduled for 02/01/20 @1030 . I have informed Katelyn Sharp and she verbalized understanding.I have also educated her to continue taking Norvasc as prescribed and to keep a record or her blood pressure at home. Drusilla Kanner RN BSn PCCn [phone]

## 2020-01-23 NOTE — Telephone Encounter (Signed)
I have called [facility] at [facility] to scheduled appoint per Ms Scorza request. I was informed that Ms Pasquini was already seen and does not need to be seen again at this time. Arman Bogus RN BSN BSN [phone]

## 2020-01-31 ENCOUNTER — Telehealth: Payer: Self-pay

## 2020-01-31 NOTE — Telephone Encounter (Signed)
Have called [employer] [facility] and arranged for transport to and from [facility] office tomorrow April 9th 2021. I have informed the patient of pick up time 08:52 hrs.  Arman Bogus RN BSN PCCN  [phone]

## 2020-02-01 ENCOUNTER — Encounter: Payer: Self-pay | Admitting: Family Medicine

## 2020-02-01 ENCOUNTER — Ambulatory Visit (INDEPENDENT_AMBULATORY_CARE_PROVIDER_SITE_OTHER): Payer: Medicaid Other | Admitting: Family Medicine

## 2020-02-01 ENCOUNTER — Other Ambulatory Visit: Payer: Self-pay

## 2020-02-01 VITALS — BP 142/80 | HR 81 | Ht 64.17 in | Wt 166.4 lb

## 2020-02-01 DIAGNOSIS — I10 Essential (primary) hypertension: Secondary | ICD-10-CM | POA: Diagnosis not present

## 2020-02-01 DIAGNOSIS — N898 Other specified noninflammatory disorders of vagina: Secondary | ICD-10-CM

## 2020-02-01 LAB — POCT WET PREP (WET MOUNT)
Clue Cells Wet Prep Whiff POC: NEGATIVE
Trichomonas Wet Prep HPF POC: ABSENT

## 2020-02-01 MED ORDER — AMLODIPINE BESYLATE 5 MG PO TABS: 5.0000 mg | ORAL_TABLET | Freq: Every day | ORAL | 3 refills | Status: DC

## 2020-02-01 NOTE — Progress Notes (Signed)
    SUBJECTIVE:   CHIEF COMPLAINT / HPI:   The patient speaks Swahili as their primary language.  An interpreter was used for the entire visit.   Katelyn Sharp is a pleasant 29 year old G4P4 now 7 weeks postpartum presenting today for a postpartum check and BP check.  She has not been taking amlodipine. She is breastfeeding. No chest pain, HA, vision changes, dyspnea. No issues with medications.   She feels she is bonding with the baby. No difficulty with breastfeeding. She has not had intercourse. Using Depo Provera.   PERTINENT  PMH / PSH/Family/Social History :  Reviewed and updated  No family history of HTN   OBJECTIVE:   BP (!) 142/80   Pulse 81   Ht 5' 4.17" (1.63 m)   Wt 166 lb 6.4 oz (75.5 kg)   SpO2 98%   BMI 28.41 kg/m   HEENT: Sclera anicteric. Dentition is moderate. Appears well hydrated. Neck: Supple Cardiac: Regular rate and rhythm. Normal S1/S2. No murmurs, rubs, or gallops appreciated. Lungs: Clear bilaterally to ascultation.  Abdomen: Normoactive bowel sounds. No tenderness to deep or light palpation. No rebound or guarding.  Extremities: Warm, well perfused without edema.  Skin: Warm, dry Psych: Pleasant and appropriate  GU Exam:  Chaperoned exam.  External exam: Normal-appearing female external genitalia.  Vaginal exam notable for small amount of scant discharge.  Cervix without discharge or obvious lesion.  Bimanual exam reveals normal sized uterus no masses appreciated, no pain with examination.    ASSESSMENT/PLAN:   Essential hypertension Amlodipine restarted. BMP today. Discussed dietary changes.  Unlikely to be secondary cause mild readings (though persistently elevated) Could consider renal ultrasound in future Cardiac examination unremarkable    Scant vaginal discharge No abnormalities on examination Continue depo provera-happy with method   Follow up appointment scheduled for BP check   Katelyn Starr, MD  Family Medicine Teaching  Service  [facility]

## 2020-02-01 NOTE — Patient Instructions (Addendum)
It was wonderful to see you today.  Please bring ALL of your medications with you to every visit.   Thank you for choosing [facility].   Please call [phone] with any questions about today's appointment.  Please be sure to schedule follow up at the front  desk before you leave today.   Terisa Starr, MD  Family Medicine   Please follow up in 1 month for your blood pressure

## 2020-02-01 NOTE — Assessment & Plan Note (Signed)
Amlodipine restarted. BMP today. Discussed dietary changes.  Unlikely to be secondary cause mild readings (though persistently elevated) Could consider renal ultrasound in future Cardiac examination unremarkable

## 2020-02-02 LAB — BASIC METABOLIC PANEL
BUN/Creatinine Ratio: 14 (ref 9–23)
BUN: 9 mg/dL (ref 6–20)
CO2: 19 mmol/L — ABNORMAL LOW (ref 20–29)
Calcium: 9.2 mg/dL (ref 8.7–10.2)
Chloride: 107 mmol/L — ABNORMAL HIGH (ref 96–106)
Creatinine, Ser: 0.66 mg/dL (ref 0.57–1.00)
GFR calc Af Amer: 138 mL/min/{1.73_m2} (ref >59)
GFR calc non Af Amer: 120 mL/min/{1.73_m2} (ref >59)
Glucose: 127 mg/dL — ABNORMAL HIGH (ref 65–99)
Potassium: 3.8 mmol/L (ref 3.5–5.2)
Sodium: 139 mmol/L (ref 134–144)

## 2020-02-04 ENCOUNTER — Encounter: Payer: Self-pay | Admitting: Family Medicine

## 2020-02-12 ENCOUNTER — Telehealth: Payer: Self-pay

## 2020-02-12 NOTE — Telephone Encounter (Signed)
Katelyn Sharp called requestion transportation assistance to take her 2 kids to the doctors clinic.Same done. Arman Bogus RN BSN PCCN. [phone]

## 2020-03-10 ENCOUNTER — Telehealth: Payer: Self-pay

## 2020-03-10 ENCOUNTER — Encounter: Payer: Self-pay | Admitting: Family Medicine

## 2020-03-10 ENCOUNTER — Other Ambulatory Visit: Payer: Self-pay

## 2020-03-10 ENCOUNTER — Ambulatory Visit (INDEPENDENT_AMBULATORY_CARE_PROVIDER_SITE_OTHER): Payer: Medicaid Other | Admitting: Family Medicine

## 2020-03-10 VITALS — BP 124/78 | HR 72 | Ht 64.0 in | Wt 178.8 lb

## 2020-03-10 DIAGNOSIS — Z3049 Encounter for surveillance of other contraceptives: Secondary | ICD-10-CM | POA: Diagnosis not present

## 2020-03-10 DIAGNOSIS — Z131 Encounter for screening for diabetes mellitus: Secondary | ICD-10-CM

## 2020-03-10 DIAGNOSIS — I10 Essential (primary) hypertension: Secondary | ICD-10-CM

## 2020-03-10 DIAGNOSIS — Z3040 Encounter for surveillance of contraceptives, unspecified: Secondary | ICD-10-CM | POA: Insufficient documentation

## 2020-03-10 NOTE — Progress Notes (Signed)
    SUBJECTIVE:   CHIEF COMPLAINT / HPI:  The patient speaks Swahili as their primary language.  An interpreter was used for the entire visit.   Katelyn Sharp 29-year-old woman with history significant for hypertension presenting today for blood pressure check.    She reports overall she is concerned about her contraception.  She has noticed weight gain over the past few months.  She has not been sexually active.  She is having spotting several times a week and believes this is related to her contraception.  She received a Depo injection prior to discharge from labor and delivery.  She denies heavy bleeding, daily bleeding or symptoms of anemia.  Recent pelvic exam showed no abnormalities. She is breastfeeding.  She is considering all long-term reversible contraceptive option but is still deciding.  She would like information today.\  HTN Taking amlodipine. No edema or side effects. BP at goal.  Weight Gain She is unhappy about this. She has noticed she is eating more. No polyuria or polydipsia.   PERTINENT  PMH / PSH/Family/Social History : Reviewed and updated as appropriate  OBJECTIVE:   BP 124/78   Pulse 72   Ht 5\' 4"  (1.626 m)   Wt 178 lb 12.8 oz (81.1 kg)   SpO2 98%   BMI 30.69 kg/m   HEENT: Sclera anicteric. Dentition is moderate. Appears well hydrated. Neck: Supple Cardiac: Regular rate and rhythm. Normal S1/S2. No murmurs, rubs, or gallops appreciated. Lungs: Clear bilaterally to ascultation.  Extremities: Warm, well perfused without edema.  Skin: Warm, dry Psych: Pleasant and appropriate     ASSESSMENT/PLAN:   Essential hypertension At goal today.   Contraceptive surveillance Discussed options at length.  Discussed Nexplanon, IUD.  She is currently breast-feeding.  She does not like to have for several more years.  She has not had intercourse since prior to pregnancy.  Information given and Swahili.  She is scheduled for follow-up for her repeat Depo later this  week.   Weight gain we discussed this at length.  Recommended increasing activity reducing sugar sweetened beverages and sweets.  A1c today.  Suspect this is due to Depo.  Recommended follow-up for further discussion.  Instructed patient to call when she decides on alternative contraception option.  , MD  Family Medicine Teaching Service  [facility]

## 2020-03-10 NOTE — Patient Instructions (Signed)
It was wonderful to see you today.  Please bring ALL of your medications with you to every visit.   Thank you for choosing [facility].   Please call [phone] with any questions about today's appointment.  Please be sure to schedule follow up at the front  desk before you leave today.   Terisa Starr, MD  Family Medicine   Follow up in 3 months  Please call if you would like to change contraceptive methods  You have an appointment this week for the Depo Shot  I will call you or send a letter with blood work results  [facility] YOU for coming to see me today

## 2020-03-10 NOTE — Assessment & Plan Note (Signed)
Discussed options at length.  Discussed Nexplanon, IUD.  She is currently breast-feeding.  She does not like to have for several more years.  She has not had intercourse since prior to pregnancy.  Information given and Swahili.  She is scheduled for follow-up for her repeat Depo later this week.

## 2020-03-10 NOTE — Telephone Encounter (Signed)
Ms Mallis called reuesting transportation to and from Doctor`s appointment. Same done. Nicole Cella Benigno Check RN BSn PCCN [phone] -cell

## 2020-03-10 NOTE — Assessment & Plan Note (Signed)
At goal today

## 2020-03-11 ENCOUNTER — Telehealth: Payer: Self-pay

## 2020-03-11 NOTE — Telephone Encounter (Signed)
Ms Dlouhy called requesting transport assistance to and from the doctor`s appointment. Same scheduled. Nicole Cella Brunette Lavalle RN BSN PCCn  [phone]-office [phone]-Cell

## 2020-03-12 NOTE — Telephone Encounter (Signed)
Transportation request completed

## 2020-03-14 ENCOUNTER — Encounter: Payer: Self-pay | Admitting: Family Medicine

## 2020-03-14 ENCOUNTER — Ambulatory Visit: Payer: Medicaid Other

## 2020-03-14 LAB — HEMOGLOBIN A1C
Est. average glucose Bld gHb Est-mCnc: 143 mg/dL
Hgb A1c MFr Bld: 6.6 % — ABNORMAL HIGH (ref 4.8–5.6)

## 2020-03-14 LAB — ALDOSTERONE + RENIN ACTIVITY W/ RATIO
ALDOS/RENIN RATIO: 34.6 — ABNORMAL HIGH (ref 0.0–30.0)
ALDOSTERONE: 6.6 ng/dL (ref 0.0–30.0)
Renin: 0.191 ng/mL/hr (ref 0.167–5.380)

## 2020-03-20 ENCOUNTER — Telehealth: Payer: Self-pay | Admitting: Family Medicine

## 2020-03-20 NOTE — Telephone Encounter (Signed)
Called with results. A1C in diabetic range. Repeat fasting BMP and renin/aldo at follow up given elevation (confirmatory). Scheduled follow up, all questions answered.   Thanks, Terisa Starr, MD  [facility] Medicine Teaching Service

## 2020-04-10 ENCOUNTER — Telehealth: Payer: Self-pay

## 2020-04-10 NOTE — Telephone Encounter (Signed)
I called [facility] transportation services and made arrangements for rides  to and from the doctor`s office. Nicole Cella Oliviya Gilkison RN BSN PCCN [phone]-cell [phone]-office

## 2020-04-14 ENCOUNTER — Telehealth: Payer: Self-pay

## 2020-04-14 NOTE — Telephone Encounter (Signed)
Katelyn Sharp called requesting assistance to and from Doctor`s office. Same Marchia Meiers Ladora Osterberg RN BSn PCCN [phone]-cell [phone]-office

## 2020-04-21 ENCOUNTER — Encounter: Payer: Self-pay | Admitting: Family Medicine

## 2020-04-21 ENCOUNTER — Other Ambulatory Visit: Payer: Self-pay

## 2020-04-21 ENCOUNTER — Ambulatory Visit (INDEPENDENT_AMBULATORY_CARE_PROVIDER_SITE_OTHER): Payer: Medicaid Other | Admitting: Family Medicine

## 2020-04-21 VITALS — BP 132/78 | HR 67 | Wt 185.2 lb

## 2020-04-21 DIAGNOSIS — I10 Essential (primary) hypertension: Secondary | ICD-10-CM | POA: Diagnosis not present

## 2020-04-21 DIAGNOSIS — R635 Abnormal weight gain: Secondary | ICD-10-CM

## 2020-04-21 DIAGNOSIS — Z3049 Encounter for surveillance of other contraceptives: Secondary | ICD-10-CM

## 2020-04-21 DIAGNOSIS — Z862 Personal history of diseases of the blood and blood-forming organs and certain disorders involving the immune mechanism: Secondary | ICD-10-CM | POA: Diagnosis not present

## 2020-04-21 DIAGNOSIS — E119 Type 2 diabetes mellitus without complications: Secondary | ICD-10-CM | POA: Insufficient documentation

## 2020-04-21 MED ORDER — AMLODIPINE BESYLATE 5 MG PO TABS: 5.0000 mg | ORAL_TABLET | Freq: Every day | ORAL | 3 refills | Status: DC

## 2020-04-21 MED ORDER — METFORMIN HCL ER 500 MG PO TB24: 500.0000 mg | ORAL_TABLET | Freq: Every day | ORAL | 3 refills | Status: DC

## 2020-04-21 NOTE — Assessment & Plan Note (Signed)
Discussed, declined medications. Recommended prenatal.

## 2020-04-21 NOTE — Patient Instructions (Addendum)
I highly recommend the COVID vaccine. These vaccines have excellent safety data. Please ask ANY questions you may have.   [place] has multiple sites for vaccination---appointments are encouraged (online or call the number below) but you can also just walk in.    [address] [place])  [place] [address] [place], [place] [address] Hours: Mon,Thu 8-5, Sat 8-12 Type: [employer]   [address]  [facility] [place])  [place] [employer] [address] [place], [place] [address] Hours: Thu: 1-5 Type: Pfizer & Moderna  Phone Assistance: Please call [phone], Monday through Friday between 7 a.m. and 7 p.m.  NO ID Required For Vaccine Clinics: Vaccine providers may ask for an ID for insurance or HRSA reimbursement purposes. However, everyone will be vaccinated even if they don't present an ID. No one will be turned away.   Other Vaccine Locations: Find all vaccine locations throughout the state of [place] at [email or web address]   It was wonderful to see you today.  Please bring ALL of your medications with you to every visit.   Today we talked about:   -Dietary changes---reduce sugary beverages and sodas    Thank you for choosing [facility].   Please call [phone] with any questions about today's appointment.  Please be sure to schedule follow up at the front  desk before you leave today.   Terisa Starr, MD  Family Medicine   Reginas appointment is tomorrow 6/29 at 11:10 AM   Mr. Arta Silence has an appointment on Friday at 830 AM with Dr. Rachael Darby   I will look for [name]'s paperwork  You are due for an appointment in August  Go to the pharmacy to get both of your medications

## 2020-04-21 NOTE — Assessment & Plan Note (Signed)
Discussed- start low dose metformin. Follow up in August---likely related to postpartum weight gain.

## 2020-04-21 NOTE — Assessment & Plan Note (Signed)
Recommended restarting amlodipine. Will recheck aldo/renin--suspect she does not have secondary cause as now improving (has been off medications). Recommended weight loss, could consider discontinuing CCB in future if BP improves and has weight loss. Discussed refill process.

## 2020-04-21 NOTE — Progress Notes (Signed)
    SUBJECTIVE:   CHIEF COMPLAINT / HPI:   Katelyn Sharp is a 29 y.o. female with a history of T2DM and secondary hypertension who presents for a follow up for repeat of aldo/renin levels and diabetes.  The patient speaks Swahili as their primary language.  An interpreter was used for the entire visit.   Secondary hypertension Ran out of medications, does not take amlodipine.  Side effects (headaches, SOB, dizziness, chest pain, visual changes)? - none BP is elevated. She is disappointed in weight gain and believes this is due to the Depo.   Elevated A1C Patient had elevated A1C of 6.6 and fasting BG of 127, qualifying her for diabetes. Denies polyuria or polydipsia. Eats primarily meats, some carbohydrates. Is not regularly exercising. Is interested in cutting down on simple carbohydrates and fatty foods.   Menses The patient is breastfeeding. She stopped Depo-Provera and has had return of menses this month. They have been irregular. She declines contraception, reports she will use rhythm method.  She is currently on menses. Has not yet resumed intercourse.  History of anemia The patient is wondering if she needs to continue iron tablets. Has resumed menses as above. Was not previously anemic. Denies dizziness, chest pain, excess fatigue, dyspnea.    PERTINENT  PMH / PSH: T2DM, possible hypertension to elevated aldo/renin level  OBJECTIVE:   BP 132/78   Pulse 67   Wt 185 lb 3.2 oz (84 kg)   SpO2 98%   BMI 31.79 kg/m   Cardiac: Warm well perfused.  Capillary refill less than 3 seconds Respiratory breathing comfortably on room air Psych: Pleasant normal affect, appropriate, normal rate of speech  ASSESSMENT/PLAN:   Contraceptive surveillance Discussed, declined medications. Recommended prenatal.   Essential hypertension Recommended restarting amlodipine. Will recheck aldo/renin--suspect she does not have secondary cause as now improving (has been off medications).  Recommended weight loss, could consider discontinuing CCB in future if BP improves and has weight loss. Discussed refill process.   Well controlled type 2 diabetes mellitus (HCC) Discussed- start low dose metformin. Follow up in August---likely related to postpartum weight gain.    HCM Discussed COVID vaccine.   Patient seen along with MS3 student [name]. I personally evaluated this patient along with the student, and verified all aspects of the history, physical exam, and medical decision making as documented by the student. I agree with the student's documentation and have made all necessary edits.   Westley Chandler, MD [facility]

## 2020-04-22 ENCOUNTER — Telehealth: Payer: Self-pay

## 2020-04-22 NOTE — Telephone Encounter (Signed)
Transportation to and from the doctor`s office done on 04/21/20. Transport provided by [employer]. Nicole Cella Rayann Jolley RN BSN PCCN [phone]-cell [phone]-office

## 2020-04-24 ENCOUNTER — Encounter: Payer: Self-pay | Admitting: Family Medicine

## 2020-04-25 LAB — BASIC METABOLIC PANEL
BUN/Creatinine Ratio: 21 (ref 9–23)
BUN: 14 mg/dL (ref 6–20)
CO2: 22 mmol/L (ref 20–29)
Calcium: 9.2 mg/dL (ref 8.7–10.2)
Chloride: 108 mmol/L — ABNORMAL HIGH (ref 96–106)
Creatinine, Ser: 0.67 mg/dL (ref 0.57–1.00)
GFR calc Af Amer: 137 mL/min/{1.73_m2} (ref >59)
GFR calc non Af Amer: 119 mL/min/{1.73_m2} (ref >59)
Glucose: 112 mg/dL — ABNORMAL HIGH (ref 65–99)
Potassium: 4.3 mmol/L (ref 3.5–5.2)
Sodium: 144 mmol/L (ref 134–144)

## 2020-04-25 LAB — CBC
Hematocrit: 39.1 % (ref 34.0–46.6)
Hemoglobin: 12.9 g/dL (ref 11.1–15.9)
MCH: 30.9 pg (ref 26.6–33.0)
MCHC: 33 g/dL (ref 31.5–35.7)
MCV: 94 fL (ref 79–97)
Platelets: 274 10*3/uL (ref 150–450)
RBC: 4.18 x10E6/uL (ref 3.77–5.28)
RDW: 13.3 % (ref 11.7–15.4)
WBC: 3.7 10*3/uL (ref 3.4–10.8)

## 2020-04-25 LAB — ALDOSTERONE + RENIN ACTIVITY W/ RATIO
ALDOS/RENIN RATIO: 22 (ref 0.0–30.0)
ALDOSTERONE: 12.1 ng/dL (ref 0.0–30.0)
Renin: 0.549 ng/mL/hr (ref 0.167–5.380)

## 2021-01-19 ENCOUNTER — Encounter (HOSPITAL_COMMUNITY): Payer: Self-pay | Admitting: Emergency Medicine

## 2021-01-19 ENCOUNTER — Ambulatory Visit (HOSPITAL_COMMUNITY)
Admission: EM | Admit: 2021-01-19 | Discharge: 2021-01-19 | Disposition: A | Discharge status: Home / Self Care | Payer: Medicaid Other | Attending: Family Medicine | Admitting: Family Medicine

## 2021-01-19 ENCOUNTER — Other Ambulatory Visit: Payer: Self-pay

## 2021-01-19 DIAGNOSIS — K0889 Other specified disorders of teeth and supporting structures: Secondary | ICD-10-CM | POA: Diagnosis not present

## 2021-01-19 DIAGNOSIS — I10 Essential (primary) hypertension: Secondary | ICD-10-CM

## 2021-01-19 MED ORDER — IBUPROFEN 800 MG PO TABS: 800.0000 mg | ORAL_TABLET | Freq: Three times a day (TID) | ORAL | 0 refills | Status: DC

## 2021-01-19 MED ORDER — AMOXICILLIN-POT CLAVULANATE 875-125 MG PO TABS: 1.0000 | ORAL_TABLET | Freq: Two times a day (BID) | ORAL | 0 refills | Status: DC

## 2021-01-19 NOTE — ED Provider Notes (Signed)
[facility]   [id number] 01/19/21 Arrival Time: 1009  ASSESSMENT & PLAN:  1. Pain, dental   2. Elevated blood pressure reading in office with diagnosis of hypertension    No sign of abscess requiring I&D at this time. Discussed.  Begin: Meds ordered this encounter  Medications  . amoxicillin-clavulanate (AUGMENTIN) 875-125 MG tablet    Sig: Take 1 tablet by mouth every 12 (twelve) hours.    Dispense:  20 tablet    Refill:  0  . ibuprofen (ADVIL) 800 MG tablet    Sig: Take 1 tablet (800 mg total) by mouth 3 (three) times daily with meals.    Dispense:  21 tablet    Refill:  0    Follow-up Information    [facility].   Specialty: Urgent Care Why: If worsening or failing to improve as anticipated. Contact information: [address] [place] [address] [phone]       Westley Chandler, MD.   Specialty: Family Medicine Why: As needed. Contact information: [address] [place] [address] [phone]               Reviewed expectations re: course of current medical issues. Questions answered. Outlined signs and symptoms indicating need for more acute intervention. Patient verbalized understanding. After Visit Summary given.   SUBJECTIVE: Kiswahili interpreter used. Katelyn Sharp is a 30 y.o. female who reports gradual onset of right lower dental pain described as aching. Present for 2 days. Fever: absent. Tolerating PO intake but reports pain with chewing. Normal swallowing. She does not see a dentist regularly. No neck swelling or pain. OTC analgesics without relief.  Increased blood pressure noted today. Reports that she is treated for HTN. No CP/SOB.   OBJECTIVE: Vitals:   01/19/21 1055  BP: (!) 159/97  Pulse: 76  Resp: 16  Temp: 98.5 F (36.9 C)  TempSrc: Oral  SpO2: 98%    General appearance: alert; no distress HENT: normocephalic; atraumatic; dentition: good; right lower rear gum  without areas of fluctuance, drainage, or bleeding and with tenderness to palpation; normal jaw movement without difficulty; throat with mild irritation Neck: supple without LAD; FROM; trachea midline CV: reg Lungs: normal respirations; unlabored; speaks full sentences without difficulty Skin: warm and dry Psychological: alert and cooperative; normal mood and affect  No Known Allergies  Past Medical History:  Diagnosis Date  . Essential hypertension   . Type 2 diabetes mellitus (HCC)    Social History   Socioeconomic History  . Marital status: Married    Spouse name: Not on file  . Number of children: Not on file  . Years of education: Not on file  . Highest education level: Not on file  Occupational History  . Not on file  Tobacco Use  . Smoking status: Never Smoker  . Smokeless tobacco: Never Used  Substance and Sexual Activity  . Alcohol use: Never  . Drug use: Never  . Sexual activity: Yes    Partners: Male    Birth control/protection: None  Other Topics Concern  . 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 Language: Swahili/Kiswahili   Able to Read in Primary Language: Yes   Able to Write in Primary Language: Yes   Education: [employer]   Prior Work: Worked at home   Marital Status: Married   Sexual Activity: Yes   Tuberculosis Screening Health Department: Not Completed   Health Department Labs Completed: No(obtaining today)   History of Trauma: None   Do You Feel Jumpy or Nervous?: No   Are You Very Watchful or 'Super Alert'?: No   Social Determinants of Health   Financial Resource Strain: Not on file  Food Insecurity: Not on file   Transportation Needs: Not on file  Physical Activity: Not on file  Stress: Not on file  Social Connections: Not on file  Intimate Partner Violence: Not on file   Family History  Problem Relation Age of Onset  . Bleeding Disorder Neg Hx   . Hypertension Neg Hx   . Diabetes Neg Hx    Past Surgical History:  Procedure Laterality Date  . PERINEAL LACERATION REPAIR     G1 pregnancy      Mardella Layman, MD 01/19/21 1115

## 2021-01-19 NOTE — ED Triage Notes (Signed)
Pt presents with mouth and throat pain xs 2 days.

## 2021-01-19 NOTE — Discharge Instructions (Signed)
Your blood pressure was noted to be elevated during your visit today. If you are currently taking medication for high blood pressure, please ensure you are taking this as directed. If you do not have a history of high blood pressure and your blood pressure remains persistently elevated, you may need to begin taking a medication at some point. You may return here within the next few days to recheck if unable to see your primary care provider or if you do not have a one.  BP (!) 159/97 (BP Location: Right Arm)   Pulse 76   Temp 98.5 F (36.9 C) (Oral)   Resp 16   LMP 01/06/2021   SpO2 98%   BP Readings from Last 3 Encounters:  01/19/21 (!) 159/97  04/21/20 132/78  03/10/20 124/78

## 2021-08-04 ENCOUNTER — Ambulatory Visit (INDEPENDENT_AMBULATORY_CARE_PROVIDER_SITE_OTHER): Payer: Medicaid Other | Admitting: Family Medicine

## 2021-08-04 ENCOUNTER — Other Ambulatory Visit: Payer: Self-pay

## 2021-08-04 VITALS — BP 136/86 | HR 76 | Ht 64.17 in | Wt 180.5 lb

## 2021-08-04 DIAGNOSIS — I10 Essential (primary) hypertension: Secondary | ICD-10-CM

## 2021-08-04 DIAGNOSIS — E119 Type 2 diabetes mellitus without complications: Secondary | ICD-10-CM | POA: Diagnosis not present

## 2021-08-04 LAB — POCT GLYCOSYLATED HEMOGLOBIN (HGB A1C): HbA1c, POC (prediabetic range): 6.3 % (ref 5.7–6.4)

## 2021-08-04 NOTE — Progress Notes (Signed)
   SUBJECTIVE:   CHIEF COMPLAINT / HPI:    Katelyn Sharp is a 30 y.o. female here for follow up of her chronic health diseases. She would like to get her vaccines updated today.    T2DM Patient has not taken metformin in quite some time. Does not check her blood sugar. Denies polyuria, polydipsia or polyphagia.   HTN Takes no medication. Previously took Norvasc. Has not taken anything for a while. Denies chest pain, palpitations, lower extremity edema, exertional dyspnea, lightheadedness, headaches and vision changes.    PERTINENT  PMH / PSH: reviewed and updated as appropriate   OBJECTIVE:   BP 136/86   Pulse 76   Ht 5' 4.17" (1.63 m)   Wt 180 lb 8 oz (81.9 kg)   SpO2 98%   BMI 30.82 kg/m    GEN: pleasant well appearing female, in no acute distress  CV: regular rate and rhythm, no murmurs appreciated  RESP: no increased work of breathing, clear to ascultation bilaterally ABD: Bowel sounds present. Soft, non-tender, non-distended.  MSK: no edema SKIN: warm, dry, no rash on visible skin    ASSESSMENT/PLAN:   Essential hypertension Stable.  BP at goal without medication. Discontinue amlodipine. Follow up with PCP in 3 months. Suspect elevated BP's were related to weight gain from recent pregnancy.   Well controlled type 2 diabetes mellitus (HCC) Stable. She is not taking metformin. A1c today 6.3 and previously 6.6.  Discontinue metformin. Encouraged continued diet rich in vegetables and complex carbs. Discussed strategies to incorporate exercise into her schedule.      Katha Cabal, DO PGY-3, [facility] 08/06/2021

## 2021-08-04 NOTE — Patient Instructions (Addendum)
Ningependa Catalina [place] tena baada ya miezi 6 lakini ikiwa unahitaji [place] mapema zaidi ya hapo kwa matoleo mapya ambayo tunafurahi [place], piga simu tu!  Tembelea [place]: Molli Posey kazi tabia zako za kula kiafya na kuingiza mazoezi katika maisha yako ya kila siku. (tazama hapa chini) - Herminio [place] lako ni kuwa na A1c <7   Mapendekezo ya Lishe ya Pre-diabetes Wang[place] ni pamoja na wanga, sukari, na nyuzinyuzi. Kati ya hizi, sukari na wanga pekee huongeza sukari ya damu. (nyuzinyuzi hupatikana [place], mbogamboga [place] ngozi, [place], na mabua] na nafaka nzima.) Vyakula vya wanga (kabuni): [place], wali, tambi, viazi, mahindi, nafaka, grits, crackers, bagels, muffins, bidhaa zote zilizookwa. Haskell Flirt, [place], na mtindi pia vina [place], lakini vingi vya vyakula hivi havitaongeza sukari kwenye damu kama vyakula vingi vya wanga.) Matunda machache husababisha sukari nyingi kwenye damu; tumia sehemu ndogo za ndizi (kikomo cha 1/2 kwa wakati mmoja), zabibu, tikiti [place], [place], na matunda mengi ya kitropiki. Vyakula vya protini: Lamar Laundry, [place], [address], vyakula vya [place], na maharagwe kama vile pinto na maharagwe ya figo (maharage pia hutoa wanga).  1. Kula angalau milo 3 HALISI na vitafunio 1-2 kwa siku. Kamwe usiende zaidi ya masaa 4-5 [place] macho bila kula. Kula kifungua kinywa ndani ya saa ya kwanza baada [place]. 2. Punguza vyakula vya wanga kuwa MBILI kwa kila mlo na KIMOJA kwa vitafunio. Sehemu moja ya chakula cha wanga ni sawa na yafuatayo: - Kipande kimoja cha mkate (au sawa na hiyo, kama nusu ya bun ya hamburger). - 1/2 kikombe cha chakula cha "kupikwa" cha wanga kama vile viazi au mchele. - Gramu 15 za Jumla ya Wanga kama inavyoonyeshwa kwenye lebo ya chakula. Janann August 4 za kinywaji tamu (pamoja na juisi ya [place]). 3. Jumuisha Mannie Stabile mlo: chakula cha protini, chakula cha wanga, [place] na/au matunda. - Pata mara mbili ya kiasi cha mboga kama vyakula vya protini  au wanga kwa chakula cha mchana na cha jioni. - Mboga safi au waliogandishwa ni bora zaidi. - Weka mboga zilizogandishwa mkononi kwa ajili ya chakula cha haraka cha [place].    Tafadhali leta dawa zako zote kwa kila ziara.   Kuwa mwangalifu, Dkt Glory Graefe  I'd like to see you back in 6 months but if you need to be seen earlier than that for any new issues we're happy to fit you in, just give [place] a call!  Visit Remembers: - Continue to work on your healthy eating habits and incorporating exercise into your daily life. (see below) - Your goal is to have an A1c < 7   Diet Recommendations for Pre-diabetes  Carbohydrate includes starch, sugar, and fiber.  Of these, only sugar and starch raise blood glucose.  (Fiber is found in fruits, vegetables [especially skin, seeds, and stalks] and whole grains.)   Starchy (carb) foods: Bread, rice, pasta, potatoes, corn, cereal, grits, crackers, bagels, muffins, all baked goods.  (Fruit, milk, and yogurt also have carbohydrate, but most of these foods will not spike your blood sugar as most starchy foods will.)  A few fruits do cause high blood sugars; use small portions of bananas (limit to 1/2 at a time), grapes, watermelon, oranges, and most tropical fruits.   Protein foods: Meat, fish, poultry, eggs, dairy foods, and beans such as pinto and kidney beans (beans also provide carbohydrate).   1. Eat at least REAL 3 meals and 1-2 snacks per day. Never go more than 4-5 hours while awake without eating. Eat breakfast within the first hour of getting up.   2.  Limit starchy foods to TWO per meal and ONE per snack. ONE portion of a starchy food is equal to the following:   - ONE slice of bread (or its equivalent, such as half of a hamburger bun).   - 1/2 cup of a "scoopable" starchy food such as potatoes or rice.   - 15 grams of Total Carbohydrate as shown on food label.   - Every 4 ounces of a sweet drink (including fruit juice). 3. Include at every meal: a  protein food, a carb food, and vegetables and/or fruit.   - Obtain twice the volume of veg's as protein or carbohydrate foods for both lunch and dinner.   - Fresh or frozen veg's are best.   - Keep frozen veg's on hand for a quick vegetable serving.       Please bring all of your medications with you to each visit.    Take care,  Dr. Katherina Right [facility]

## 2021-08-06 ENCOUNTER — Encounter: Payer: Self-pay | Admitting: Family Medicine

## 2021-08-06 NOTE — Assessment & Plan Note (Addendum)
Stable. She is not taking metformin. A1c today 6.3 and previously 6.6.  Discontinue metformin. Encouraged continued diet rich in vegetables and complex carbs. Discussed strategies to incorporate exercise into her schedule.

## 2021-08-06 NOTE — Assessment & Plan Note (Addendum)
Stable.  BP at goal without medication. Discontinue amlodipine. Follow up with PCP in 3 months. Suspect elevated BP's were related to weight gain from recent pregnancy.

## 2021-09-02 DIAGNOSIS — Z3200 Encounter for pregnancy test, result unknown: Secondary | ICD-10-CM | POA: Diagnosis not present

## 2021-09-02 DIAGNOSIS — Z23 Encounter for immunization: Secondary | ICD-10-CM | POA: Diagnosis not present

## 2021-09-08 ENCOUNTER — Other Ambulatory Visit: Payer: Self-pay

## 2021-09-08 ENCOUNTER — Inpatient Hospital Stay (HOSPITAL_COMMUNITY)
Admission: AD | Admit: 2021-09-08 | Discharge: 2021-09-09 | Disposition: A | Discharge status: Home / Self Care | Payer: Medicaid Other | Attending: Obstetrics and Gynecology | Admitting: Obstetrics and Gynecology

## 2021-09-08 ENCOUNTER — Encounter (HOSPITAL_COMMUNITY): Payer: Self-pay

## 2021-09-08 DIAGNOSIS — Z3A01 Less than 8 weeks gestation of pregnancy: Secondary | ICD-10-CM | POA: Diagnosis not present

## 2021-09-08 DIAGNOSIS — Z5321 Procedure and treatment not carried out due to patient leaving prior to being seen by health care provider: Secondary | ICD-10-CM | POA: Diagnosis not present

## 2021-09-08 DIAGNOSIS — O98511 Other viral diseases complicating pregnancy, first trimester: Secondary | ICD-10-CM | POA: Diagnosis not present

## 2021-09-08 DIAGNOSIS — Z3A00 Weeks of gestation of pregnancy not specified: Secondary | ICD-10-CM | POA: Diagnosis not present

## 2021-09-08 DIAGNOSIS — O219 Vomiting of pregnancy, unspecified: Secondary | ICD-10-CM | POA: Diagnosis not present

## 2021-09-08 DIAGNOSIS — U071 COVID-19: Secondary | ICD-10-CM | POA: Diagnosis present

## 2021-09-08 LAB — COMPREHENSIVE METABOLIC PANEL
ALT: 12 U/L (ref 0–44)
AST: 15 U/L (ref 15–41)
Albumin: 3.4 g/dL — ABNORMAL LOW (ref 3.5–5.0)
Alkaline Phosphatase: 46 U/L (ref 38–126)
Anion gap: 7 (ref 5–15)
BUN: <5 mg/dL — ABNORMAL LOW (ref 6–20)
CO2: 22 mmol/L (ref 22–32)
Calcium: 8.8 mg/dL — ABNORMAL LOW (ref 8.9–10.3)
Chloride: 105 mmol/L (ref 98–111)
Creatinine, Ser: 0.62 mg/dL (ref 0.44–1.00)
GFR, Estimated: >60 mL/min (ref >60)
Glucose, Bld: 114 mg/dL — ABNORMAL HIGH (ref 70–99)
Potassium: 3.4 mmol/L — ABNORMAL LOW (ref 3.5–5.1)
Sodium: 134 mmol/L — ABNORMAL LOW (ref 135–145)
Total Bilirubin: 0.6 mg/dL (ref 0.3–1.2)
Total Protein: 6.8 g/dL (ref 6.5–8.1)

## 2021-09-08 LAB — CBC
HCT: 36.4 % (ref 36.0–46.0)
Hemoglobin: 12.4 g/dL (ref 12.0–15.0)
MCH: 31.4 pg (ref 26.0–34.0)
MCHC: 34.1 g/dL (ref 30.0–36.0)
MCV: 92.2 fL (ref 80.0–100.0)
Platelets: 241 10*3/uL (ref 150–400)
RBC: 3.95 MIL/uL (ref 3.87–5.11)
RDW: 12.7 % (ref 11.5–15.5)
WBC: 4.7 10*3/uL (ref 4.0–10.5)
nRBC: 0 % (ref 0.0–0.2)

## 2021-09-08 LAB — URINALYSIS, ROUTINE W REFLEX MICROSCOPIC
Bilirubin Urine: NEGATIVE
Glucose, UA: NEGATIVE mg/dL
Hgb urine dipstick: NEGATIVE
Ketones, ur: NEGATIVE mg/dL
Leukocytes,Ua: NEGATIVE
Nitrite: NEGATIVE
Protein, ur: NEGATIVE mg/dL
Specific Gravity, Urine: 1.025 (ref 1.005–1.030)
pH: 6 (ref 5.0–8.0)

## 2021-09-08 LAB — I-STAT BETA HCG BLOOD, ED (MC, WL, AP ONLY): I-stat hCG, quantitative: >2000 m[IU]/mL — ABNORMAL HIGH (ref <5)

## 2021-09-08 LAB — RESP PANEL BY RT-PCR (FLU A&B, COVID) ARPGX2
Influenza A by PCR: NEGATIVE
Influenza B by PCR: NEGATIVE
SARS Coronavirus 2 by RT PCR: POSITIVE — AB

## 2021-09-08 LAB — LIPASE, BLOOD: Lipase: 29 U/L (ref 11–51)

## 2021-09-08 NOTE — ED Provider Notes (Signed)
Accepted by PA Nugent to the MAU.  They did ask if we could swab her for COVID prior to transfer.   Terrilee Files, MD 09/09/21 [phone]

## 2021-09-08 NOTE — ED Provider Notes (Signed)
Emergency Medicine Provider Triage Evaluation Note  Katelyn Sharp , a 30 y.o. female  was evaluated in triage.  Pt complains of chills, fever, headache, decreased appetite, and vomiting for 4 days.  She states she is vomiting about 5 times per day.  She has not been taking anything for her symptoms.  No one else at home is sick.  Review of Systems  Positive: Fever, chills, headache, nausea, vomiting, body aches Negative: Chest pain, shortness of breath, abdominal pain, diarrhea  Physical Exam  BP 128/81   Pulse 66   Temp 98.8 F (37.1 C) (Oral)   Resp 16   SpO2 100%  Gen:   Awake, no distress   Resp:  Normal effort  MSK:   Moves extremities without difficulty  Other:   Medical Decision Making  Medically screening exam initiated at 9:59 AM.  Appropriate orders placed.  Katelyn Sharp was informed that the remainder of the evaluation will be completed by another provider, this initial triage assessment does not replace that evaluation, and the importance of remaining in the ED until their evaluation is complete.  Swahili interpreter used during triage   Jeanella Flattery 09/08/21 1048    Wynetta Fines, MD 09/09/21 1558

## 2021-09-08 NOTE — MAU Note (Signed)
USED [id number] PHILIPPE PT SAYS HAS BEEN VOMITING X4 DAYS , COLD AND DIZZY LMP-  07-26-2021

## 2021-09-08 NOTE — ED Notes (Signed)
Pt name called for updated vitals, no response 

## 2021-09-08 NOTE — ED Triage Notes (Signed)
Patient complains of chills, fever, headache, decreased appetite. Reports some body pain with same x 4 days. Alert and oriented

## 2021-09-09 ENCOUNTER — Telehealth: Payer: Self-pay | Admitting: Radiology

## 2021-09-09 DIAGNOSIS — U071 COVID-19: Secondary | ICD-10-CM | POA: Diagnosis present

## 2021-09-09 MED ORDER — ONDANSETRON 4 MG PO TBDP: 4.0000 mg | ORAL_TABLET | Freq: Once | ORAL | Status: DC

## 2021-09-09 MED ORDER — PROMETHAZINE HCL 25 MG PO TABS: 25.0000 mg | ORAL_TABLET | Freq: Four times a day (QID) | ORAL | 2 refills | Status: DC | PRN

## 2021-09-09 NOTE — MAU Provider Note (Signed)
Chief Complaint: Emesis   None       SUBJECTIVE HPI: Katelyn Sharp is a 30 y.o. T7D2202 at [redacted]w[redacted]d by LMP who presents to maternity admissions reporting vomiting, malaise, dizziness and feeling cold.  Tested in ED for Covid which was positive. Patient waited in ED for a long while then came here to MAU.  Initially decided to leave but then came back so she could get a work note.  Then she left again. Unit extremely busy with difficulty getting her into a room due to volume and acuity of unit. Marland Kitchen HPI RN Note: USED [id number] PHILIPPE PT SAYS HAS BEEN VOMITING X4 DAYS , COLD AND DIZZY LMP-  07-26-2021  Past Medical History:  Diagnosis Date   Essential hypertension    Type 2 diabetes mellitus (HCC)    Past Surgical History:  Procedure Laterality Date   PERINEAL LACERATION REPAIR     G1 pregnancy    Social History   Socioeconomic History   Marital status: Married    Spouse name: Not on file   Number of children: Not on file   Years of education: Not on file   Highest education level: Not on file  Occupational History   Not on file  Tobacco Use   Smoking status: Never   Smokeless tobacco: Never  Substance and Sexual Activity   Alcohol use: Never   Drug use: Never   Sexual activity: Yes    Partners: Male    Birth control/protection: None  Other Topics Concern   Not on file  Social History Narrative   Refugee Information   Number of Immediate Family Members: 4   Number of Immediate Family Members in [place]: 4   Date of Arrival: 06/28/19   Country of Birth: [facility]   Country of Origin: [facility]   Location of Refugee Camp: [place]   Duration in [place]: 20 years or greater   Reason for Leaving Home Country: Oceanographer opinion   Primary Language: Swahili/Kiswahili   Able to Read in Primary Language: Yes   Able to Write in Primary Language: Yes   Education: [employer]   Prior Work: Worked at home   Marital Status: Married   Sexual Activity: Yes   Tuberculosis Screening Health Department:  Not Completed   Health Department Labs Completed: No(obtaining today)   History of Trauma: None   Do You Feel Jumpy or Nervous?: No   Are You Very Watchful or 'Super Alert'?: No   Social Determinants of Health   Financial Resource Strain: Not on file  Food Insecurity: Not on file  Transportation Needs: Not on file  Physical Activity: Not on file  Stress: Not on file  Social Connections: Not on file  Intimate Partner Violence: Not on file   No current facility-administered medications on file prior to encounter.   Current Outpatient Medications on File Prior to Encounter  Medication Sig Dispense Refill   Blood Pressure Monitoring DEVI 1 Device by Does not apply route once a week. (Patient not taking: Reported on 12/28/2019) 1 Device 0   ferrous gluconate (FERGON) 324 MG tablet Take 1 tablet (324 mg total) by mouth daily with breakfast. (Patient not taking: Reported on 04/21/2020) 60 tablet 3   Prenatal Vit-Fe Fumarate-FA (PRENATAL VITAMIN) 27-0.8 MG TABS Take 1 tablet by mouth daily. (Patient not taking: Reported on 04/21/2020) 30 tablet 6   No Known Allergies  I have reviewed patient's Past Medical Hx, Surgical Hx, Family Hx, Social Hx, medications and allergies.   ROS:  Review of Systems Review of Systems  Other systems negative   Physical Exam  Physical Exam Patient Vitals for the past 24 hrs:  BP Temp Temp src Pulse Resp SpO2 Height Weight  09/08/21 1937 128/74 -- Oral 64 20 -- 5\' 4"  (1.626 m) 77 kg  09/08/21 1703 116/71 -- -- (!) 58 18 100 % -- --  09/08/21 1358 113/65 98.4 F (36.9 C) Oral (!) 57 15 100 % -- --  09/08/21 0930 128/81 98.8 F (37.1 C) Oral 66 16 100 % -- --   Exam not done due to pt leaving AMA  LAB RESULTS Results for orders placed or performed during the hospital encounter of 09/08/21 (from the past 24 hour(s))  Urinalysis, Routine w reflex microscopic     Status: None   Collection Time: 09/08/21 10:44 AM  Result Value Ref Range   Color, Urine  YELLOW YELLOW   APPearance CLEAR CLEAR   Specific Gravity, Urine 1.025 1.005 - 1.030   pH 6.0 5.0 - 8.0   Glucose, UA NEGATIVE NEGATIVE mg/dL   Hgb urine dipstick NEGATIVE NEGATIVE   Bilirubin Urine NEGATIVE NEGATIVE   Ketones, ur NEGATIVE NEGATIVE mg/dL   Protein, ur NEGATIVE NEGATIVE mg/dL   Nitrite NEGATIVE NEGATIVE   Leukocytes,Ua NEGATIVE NEGATIVE  Lipase, blood     Status: None   Collection Time: 09/08/21 10:47 AM  Result Value Ref Range   Lipase 29 11 - 51 U/L  Comprehensive metabolic panel     Status: Abnormal   Collection Time: 09/08/21 10:47 AM  Result Value Ref Range   Sodium 134 (L) 135 - 145 mmol/L   Potassium 3.4 (L) 3.5 - 5.1 mmol/L   Chloride 105 98 - 111 mmol/L   CO2 22 22 - 32 mmol/L   Glucose, Bld 114 (H) 70 - 99 mg/dL   BUN <5 (L) 6 - 20 mg/dL   Creatinine, Ser 09/10/21 0.44 - 1.00 mg/dL   Calcium 8.8 (L) 8.9 - 10.3 mg/dL   Total Protein 6.8 6.5 - 8.1 g/dL   Albumin 3.4 (L) 3.5 - 5.0 g/dL   AST 15 15 - 41 U/L   ALT 12 0 - 44 U/L   Alkaline Phosphatase 46 38 - 126 U/L   Total Bilirubin 0.6 0.3 - 1.2 mg/dL   GFR, Estimated 1.60 >10 mL/min   Anion gap 7 5 - 15  CBC     Status: None   Collection Time: 09/08/21 10:47 AM  Result Value Ref Range   WBC 4.7 4.0 - 10.5 K/uL   RBC 3.95 3.87 - 5.11 MIL/uL   Hemoglobin 12.4 12.0 - 15.0 g/dL   HCT 09/10/21 23.5 - 57.3 %   MCV 92.2 80.0 - 100.0 fL   MCH 31.4 26.0 - 34.0 pg   MCHC 34.1 30.0 - 36.0 g/dL   RDW 22.0 25.4 - 27.0 %   Platelets 241 150 - 400 K/uL   nRBC 0.0 0.0 - 0.2 %  Resp Panel by RT-PCR (Flu A&B, Covid) Nasopharyngeal Swab     Status: Abnormal   Collection Time: 09/08/21 10:48 AM   Specimen: Nasopharyngeal Swab; Nasopharyngeal(NP) swabs in vial transport medium  Result Value Ref Range   SARS Coronavirus 2 by RT PCR POSITIVE (A) NEGATIVE   Influenza A by PCR NEGATIVE NEGATIVE   Influenza B by PCR NEGATIVE NEGATIVE  I-Stat beta hCG blood, ED     Status: Abnormal   Collection Time: 09/08/21 11:07 AM   Result Value Ref Range  I-stat hCG, quantitative >2,000.0 (H) <5 mIU/mL   Comment 3               IMAGING No results found.  MAU Management/MDM: I sent a prescription for Phenergan to her pharmacy for her nausea and vomiting.  Unable to get interpretor to call her at home  ASSESSMENT Pregnancy at [redacted]w[redacted]d by LMP Nausea and vomiting Covid positive  PLAN Patient left AMA prior to being seen in MAU    Wynelle Bourgeois CNM, MSN Certified Nurse-Midwife 09/09/2021  1:45 AM

## 2021-09-09 NOTE — Telephone Encounter (Signed)
Left ,message via interpreter services informing patient of New OB interview on 09/24/21 @ 9:20 @ [facility]. Included address and phone number for office in voicemail.

## 2021-09-10 ENCOUNTER — Telehealth: Payer: Self-pay

## 2021-09-10 NOTE — Telephone Encounter (Signed)
Patient called and informed of appointment date and time in Swahili. Interpreter not used for this call.Address provided and patient verbalized understanding.  Nicole Cella Kierrah Kilbride RN BSn PCCN  [facility] [phone]-cell [phone]-office

## 2021-09-16 ENCOUNTER — Telehealth: Payer: Self-pay

## 2021-09-21 ENCOUNTER — Encounter (HOSPITAL_COMMUNITY): Payer: Self-pay

## 2021-09-21 ENCOUNTER — Other Ambulatory Visit: Payer: Self-pay

## 2021-09-21 ENCOUNTER — Ambulatory Visit (HOSPITAL_COMMUNITY)
Admission: EM | Admit: 2021-09-21 | Discharge: 2021-09-21 | Disposition: A | Discharge status: Home / Self Care | Payer: Medicaid Other | Attending: Emergency Medicine | Admitting: Emergency Medicine

## 2021-09-21 DIAGNOSIS — R110 Nausea: Secondary | ICD-10-CM

## 2021-09-21 DIAGNOSIS — O99891 Other specified diseases and conditions complicating pregnancy: Secondary | ICD-10-CM | POA: Diagnosis not present

## 2021-09-21 DIAGNOSIS — R42 Dizziness and giddiness: Secondary | ICD-10-CM

## 2021-09-21 DIAGNOSIS — Z3A01 Less than 8 weeks gestation of pregnancy: Secondary | ICD-10-CM | POA: Diagnosis not present

## 2021-09-21 MED ORDER — ONDANSETRON HCL 4 MG PO TABS: 4.0000 mg | ORAL_TABLET | Freq: Four times a day (QID) | ORAL | 0 refills | Status: DC

## 2021-09-21 NOTE — Discharge Instructions (Addendum)
Pt will  need to follow up with obgyn on her already schedule appoint  [facility] if you begin to have any abd pain or vaginal bleeding  Stay hydrated well  Begin a prenatal vitamin

## 2021-09-21 NOTE — ED Provider Notes (Signed)
[facility]    CSN: [id number] Arrival date & time: 09/21/21  1018      History   Chief Complaint Chief Complaint  Patient presents with   Fever   Vomiting    HPI Katelyn Sharp is a 30 y.o. female.   Interp line used. Pt is 1 month preg and has nausea and dizziness. This is pts 5 preg and these are the same sx she has had in the past. Pt has not seen obgyn for another 2 weeks. Denies chest pain or sob. No vaginal bleeding or vag discharge.    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 Authorizing Provider  ondansetron (ZOFRAN) 4 MG tablet Take 1 tablet (4 mg total) by mouth every 6 (six) hours. 09/21/21  Yes Coralyn Mark, NP  Blood Pressure Monitoring DEVI 1 Device by Does not apply route once a week. Patient not taking: Reported on 12/28/2019 10/04/19   Conan Bowens, MD  ferrous gluconate (FERGON) 324 MG tablet Take 1 tablet (324 mg total) by mouth daily with breakfast. Patient not taking: Reported on 04/21/2020 10/23/19   Levie Heritage, DO  Prenatal  Vit-Fe Fumarate-FA (PRENATAL VITAMIN) 27-0.8 MG TABS Take 1 tablet by mouth daily. Patient not taking: Reported on 04/21/2020 10/23/19   Levie Heritage, DO  promethazine (PHENERGAN) 25 MG tablet Take 1 tablet (25 mg total) by mouth every 6 (six) hours as needed for nausea or vomiting. 09/09/21   Aviva Signs, CNM    Family History Family History  Problem Relation Age of Onset   Bleeding Disorder Neg Hx    Hypertension Neg Hx    Diabetes Neg Hx     Social History Social History   Tobacco Use   Smoking status: Never   Smokeless tobacco: Never  Substance Use Topics   Alcohol use: Never   Drug use: Never     Allergies   Patient has no known allergies.   Review of Systems Review of Systems  Constitutional:  Negative for fever.  HENT: Negative.    Respiratory: Negative.    Cardiovascular: Negative.   Gastrointestinal:  Positive for nausea. Negative for abdominal pain and vomiting.  Genitourinary: Negative.   Musculoskeletal:  Negative.   Neurological:  Positive for dizziness.    Physical Exam Triage Vital Signs ED Triage Vitals  Enc Vitals Group     BP 09/21/21 1229 136/84     Pulse Rate 09/21/21 1229 80     Resp 09/21/21 1229 18     Temp --      Temp src --      SpO2 09/21/21 1229 98 %     Weight --      Height --      Head Circumference --      Peak Flow --      Pain Score 09/21/21 1228 4     Pain Loc --      Pain Edu? --      Excl. in GC? --    No data found.  Updated Vital Signs BP 136/84 (BP Location: Right Arm)   Pulse 80   Resp 18   LMP 07/26/2021   SpO2 98%   Visual Acuity Right Eye Distance:   Left Eye Distance:   Bilateral Distance:    Right Eye Near:   Left Eye Near:    Bilateral Near:     Physical Exam Constitutional:      Appearance: Normal appearance.  Cardiovascular:     Rate and Rhythm: Normal rate.  Pulmonary:     Effort: Pulmonary effort is normal.  Abdominal:     Tenderness: There is no right CVA tenderness or  left CVA tenderness.  Skin:    General: Skin is warm.  Neurological:     General: No focal deficit present.     Mental Status: She is alert. Mental status is at baseline.     UC Treatments / Results  Labs (all labs ordered are listed, but only abnormal results are displayed) Labs Reviewed - No data to display  EKG   Radiology No results found.  Procedures Procedures (including critical care time)  Medications Ordered in UC Medications - No data to display  Initial Impression / Assessment and Plan / UC Course  I have reviewed the triage vital signs and the nursing notes.  Pertinent labs & imaging results that were available during my care of the patient were reviewed by me and considered in my medical decision making (see chart for details).     Pt will  need to follow up with obgyn on her already schedule appoint  Go to the women's hospital if you begin to have any abd pain or vaginal bleeding  Stay hydrated well  Begin a prenatal vitamin   Final Clinical Impressions(s) / UC Diagnoses   Final diagnoses:  Nausea without vomiting  Less than [redacted] weeks gestation of pregnancy  Dizziness     Discharge Instructions      Pt will  need to follow up with obgyn on her already schedule appoint  Go to the women's hospital if you begin to have any abd pain or vaginal bleeding  Stay hydrated well  Begin a prenatal vitamin      ED Prescriptions     Medication Sig Dispense Auth. Provider   ondansetron (ZOFRAN) 4 MG tablet Take 1 tablet (4 mg total) by mouth every 6 (six) hours. 12 tablet Coralyn Mark, NP      PDMP not reviewed this encounter.   Coralyn Mark, NP 09/21/21 1241

## 2021-09-21 NOTE — ED Triage Notes (Signed)
Pt presents with c/o dizziness, vomiting and no appetite.

## 2021-09-28 ENCOUNTER — Telehealth: Payer: Self-pay

## 2021-09-28 NOTE — Telephone Encounter (Signed)
Call placed back to pt with [employer]. Spoke with pt. Pt states having nausea and vomiting every morning and had episode at work today. Pt does have Rx Phenergan and Rx Zofran. Pt states was only given this at the hospital. Pt advised she has prescriptions at the pharmacy. Pt has not picked up those Rx. Pt advised to go and pick up Rx and start taking. She has virtual OB intake tomorrow at 1:15pm.  Judeth Cornfield, RN

## 2021-09-28 NOTE — Telephone Encounter (Signed)
Patient called with c/o nausea and vomiting frequently. She has intake appointment via video call tomorrow but would like to be seen in person. I have contacted the provider. Nurse will get in touch with the patient.  Nicole Cella Janeene Sand RN BSn PCCN  [facility] [phone]-cell [phone]-office

## 2021-09-29 ENCOUNTER — Ambulatory Visit (INDEPENDENT_AMBULATORY_CARE_PROVIDER_SITE_OTHER): Payer: Medicaid Other

## 2021-09-29 ENCOUNTER — Other Ambulatory Visit: Payer: Self-pay

## 2021-09-29 VITALS — BP 103/71 | HR 96 | Wt 176.1 lb

## 2021-09-29 DIAGNOSIS — O0990 Supervision of high risk pregnancy, unspecified, unspecified trimester: Secondary | ICD-10-CM | POA: Insufficient documentation

## 2021-09-29 DIAGNOSIS — U071 COVID-19: Secondary | ICD-10-CM

## 2021-09-29 MED ORDER — PRENATAL VITAMIN 27-0.8 MG PO TABS: 1.0000 | ORAL_TABLET | Freq: Every day | ORAL | 12 refills | Status: DC

## 2021-09-29 NOTE — Assessment & Plan Note (Signed)
  Nursing Staff Provider  Office Location  [facility] Dating    Language  Swahilil Anatomy US    Flu Vaccine   Genetic/Carrier Screen  NIPS:    AFP:    Horizon:  TDaP Vaccine    Hgb A1C or  GTT Early  Third trimester   COVID Vaccine    LAB RESULTS   Rhogam   Blood Type     Baby Feeding Plan Breast and bottle  Antibody    Contraception Undecided Rubella    Circumcision Yes RPR     Pediatrician  [facility] HBsAg     Support Person FOB HCVAb   Prenatal Classes  HIV       BTL Consent  GBS   (For PCN allergy, check sensitivities)   VBAC Consent  Pap        DME Rx [ ]  BP cuff [ ]  Weight Scale Waterbirth  [ ]  Class [ ]  Consent [ ]  CNM visit  PHQ9 & GAD7 [  ] new OB [  ] 28 weeks  [  ] 36 weeks Induction  [ ]  Orders Entered [ ] Foley Y/N

## 2021-10-02 ENCOUNTER — Ambulatory Visit: Payer: Medicaid Other | Admitting: Family Medicine

## 2021-10-02 ENCOUNTER — Telehealth: Payer: Self-pay | Admitting: Family Medicine

## 2021-10-02 ENCOUNTER — Encounter: Payer: Self-pay | Admitting: Family Medicine

## 2021-10-02 ENCOUNTER — Other Ambulatory Visit: Payer: Self-pay

## 2021-10-02 VITALS — BP 110/60 | HR 62 | Ht 64.0 in | Wt 168.4 lb

## 2021-10-02 DIAGNOSIS — O09891 Supervision of other high risk pregnancies, first trimester: Secondary | ICD-10-CM

## 2021-10-02 DIAGNOSIS — O0990 Supervision of high risk pregnancy, unspecified, unspecified trimester: Secondary | ICD-10-CM

## 2021-10-02 DIAGNOSIS — O219 Vomiting of pregnancy, unspecified: Secondary | ICD-10-CM

## 2021-10-02 DIAGNOSIS — E119 Type 2 diabetes mellitus without complications: Secondary | ICD-10-CM | POA: Diagnosis not present

## 2021-10-02 MED ORDER — DOXYLAMINE-PYRIDOXINE 10-10 MG PO TBEC: DELAYED_RELEASE_TABLET | ORAL | 3 refills | Status: DC

## 2021-10-02 NOTE — Patient Instructions (Addendum)
It was wonderful to see you today.  Please bring ALL of your medications with you to every visit.   Today we talked about:  ---Taking 1 pill once a day for vomiting---take very night   --You can take 1 in the morning and 1 in the night   ---Tomi Likens kidonge 1 mara moja kwa siku kwa kutapika---kunywa usiku sana  --Trinidad Curet kuchukua 1 asubuhi na 1 usiku  - kula chakula kidogo mara kwa mara  Thank you for choosing [facility].   Please call [phone] with any questions about today's appointment.  Please be sure to schedule follow up at the front  desk before you leave today.   Terisa Starr, MD  Family Medicine

## 2021-10-02 NOTE — Telephone Encounter (Signed)
Reviewed, completed, and signed form for patients work.  Note routed to RN team inbasket and placed completed form in Clinic RN's office (wall pocket above desk).Please call patient with swahili interpreter and let her know.   Westley Chandler, MD

## 2021-10-02 NOTE — Assessment & Plan Note (Addendum)
Routine prenatal labs and Ob urine culture today. Dating scan scheduled and ordered (uncertain LMP)  Diclegis for vomiting, discussed frequent meals, small volumes  Will follow with Ob-Gyn given history of anti-M antibody, type 2 diabetes, HTN.  Start aspirin at 12 weeks Will complete paperwork for work for patient- restrictions in lifting and will need breaks at work Will send glucometer to pharmacy to start checking CBG in AM and 2 hours after meals (as able, will be hard at work for her)

## 2021-10-02 NOTE — Assessment & Plan Note (Signed)
Not on any medications 

## 2021-10-02 NOTE — Progress Notes (Signed)
    SUBJECTIVE:   CHIEF COMPLAINT: nausea and vomiting  HPI:  The patient speaks Swahili as their primary language.  An interpreter was used for the entire visit.  Katelyn Sharp is a 30 yo G5P4004 at [redacted]w[redacted]d by LMP. LMP is 08/15/21, although this day is uncertain and actually the last day of her menses. Does not know first day. Endorses some nausea, intermitting vomiting 1-2 times per day. Denies pelvic pain, bleeding, vaginal discharge. Denies falls, syncope. Does report intermittent dizziness at work (works at [employer]). She is happy she is pregnant. She is taking a prenatal vitamin and no other medications. Has not been using any medications for nausea.     PERTINENT  PMH / PSH/Family/Social History : Type 2 diabetes- preconception A1C of 6.3 on no medications  HTN- at goal, on no medications   OBJECTIVE:   BP 110/60   Pulse 62   Ht 5\' 4"  (1.626 m)   Wt 168 lb 6.4 oz (76.4 kg)   LMP 08/15/2021 (Exact Date)   SpO2 98%   BMI 28.91 kg/m   Today's weight:  Last Weight  Most recent update: 10/02/2021 10:11 AM    Weight  76.4 kg (168 lb 6.4 oz)            Review of prior weights: Filed Weights   10/02/21 1010  Weight: 168 lb 6.4 oz (76.4 kg)     Cardiac: Regular rate and rhythm. Normal S1/S2. No murmurs, rubs, or gallops appreciated. Lungs: Clear bilaterally to ascultation.  Abdomen: Normoactive bowel sounds. No tenderness to deep or light palpation. No rebound or guarding.  No suprapubic pain  Psych: Pleasant and appropriate  Weight stable from last visit at our clinic    ASSESSMENT/PLAN:   Well controlled type 2 diabetes mellitus (HCC) Not on any medications.   Supervision of high risk pregnancy, antepartum Routine prenatal labs and Ob urine culture today. Dating scan scheduled and ordered (uncertain LMP)  Diclegis for vomiting, discussed frequent meals, small volumes  Will follow with Ob-Gyn given history of anti-M antibody, type 2 diabetes, HTN.   Start aspirin at 12 weeks Will complete paperwork for work for patient- restrictions in lifting and will need breaks at work Will send glucometer to pharmacy to start checking CBG in AM and 2 hours after meals (as able, will be hard at work for her)        14/09/22, MD  Family Medicine Teaching Service  [facility]

## 2021-10-04 ENCOUNTER — Telehealth: Payer: Self-pay

## 2021-10-04 LAB — URINE CULTURE, OB REFLEX

## 2021-10-04 LAB — CULTURE, OB URINE

## 2021-10-04 NOTE — Telephone Encounter (Signed)
Patient called me with questions regarding return to work letter from her doctor. Per notes, paperwork was completed for restrictions in lifting and breaks at work. Patient says that she did not receive communication from the nurse regarding this. Patient will check tomorrow if this paper work was received at her place of work.  Nicole Cella Analyssa Downs RN BSn PCCN  [facility] [phone]-cell [phone]-office

## 2021-10-05 LAB — AB SCR+ANTIBODY ID: Antibody Screen: POSITIVE — AB

## 2021-10-05 LAB — OBSTETRIC PANEL, INCLUDING HIV
Basophils Absolute: 0 10*3/uL (ref 0.0–0.2)
Basos: 0 %
EOS (ABSOLUTE): 0.1 10*3/uL (ref 0.0–0.4)
Eos: 3 %
HIV Screen 4th Generation wRfx: NONREACTIVE
Hematocrit: 33.4 % — ABNORMAL LOW (ref 34.0–46.6)
Hemoglobin: 11.4 g/dL (ref 11.1–15.9)
Hepatitis B Surface Ag: NEGATIVE
Immature Grans (Abs): 0 10*3/uL (ref 0.0–0.1)
Immature Granulocytes: 0 %
Lymphocytes Absolute: 1.3 10*3/uL (ref 0.7–3.1)
Lymphs: 24 %
MCH: 31.1 pg (ref 26.6–33.0)
MCHC: 34.1 g/dL (ref 31.5–35.7)
MCV: 91 fL (ref 79–97)
Monocytes Absolute: 0.5 10*3/uL (ref 0.1–0.9)
Monocytes: 9 %
Neutrophils Absolute: 3.5 10*3/uL (ref 1.4–7.0)
Neutrophils: 64 %
Platelets: 254 10*3/uL (ref 150–450)
RBC: 3.67 x10E6/uL — ABNORMAL LOW (ref 3.77–5.28)
RDW: 13.2 % (ref 11.7–15.4)
RPR Ser Ql: NONREACTIVE
Rh Factor: POSITIVE
Rubella Antibodies, IGG: 30.9 index (ref >0.99)
WBC: 5.4 10*3/uL (ref 3.4–10.8)

## 2021-10-05 NOTE — Telephone Encounter (Signed)
Called patient with Swahili interpreter. Informed that paperwork has been completed. Faxed to [phone]. Copy placed up front for patient to pick up for her records.   Veronda Prude, RN

## 2021-10-06 ENCOUNTER — Telehealth: Payer: Self-pay | Admitting: Family Medicine

## 2021-10-06 ENCOUNTER — Encounter: Payer: Self-pay | Admitting: Family Medicine

## 2021-10-06 NOTE — Telephone Encounter (Signed)
Called with Swahili speaking interpreter. Left generic voicemail to call back. If calls back, please identify a good time to discuss results.  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 Titer too low for [facility]--will need to send out to [facility] at follow up. Will send letter with results.   Terisa Starr, MD  Family Medicine Teaching Service

## 2021-10-08 ENCOUNTER — Telehealth: Payer: Self-pay | Admitting: Family Medicine

## 2021-10-08 ENCOUNTER — Other Ambulatory Visit: Payer: Self-pay

## 2021-10-08 ENCOUNTER — Other Ambulatory Visit: Payer: Self-pay | Admitting: Family Medicine

## 2021-10-08 ENCOUNTER — Ambulatory Visit
Admission: RE | Admit: 2021-10-08 | Discharge: 2021-10-08 | Disposition: A | Discharge status: Home / Self Care | Payer: Medicaid Other | Source: Ambulatory Visit | Attending: Family Medicine | Admitting: Family Medicine

## 2021-10-08 DIAGNOSIS — O09891 Supervision of other high risk pregnancies, first trimester: Secondary | ICD-10-CM | POA: Diagnosis not present

## 2021-10-08 DIAGNOSIS — Z3A12 12 weeks gestation of pregnancy: Secondary | ICD-10-CM | POA: Diagnosis not present

## 2021-10-08 DIAGNOSIS — Z3491 Encounter for supervision of normal pregnancy, unspecified, first trimester: Secondary | ICD-10-CM | POA: Diagnosis not present

## 2021-10-08 NOTE — Telephone Encounter (Signed)
Disability Form  dropped off form at front desk for [facility] .  Verified that patient section of form has been completed.  Last DOS/WCC with PCP was 10/02/21.  Placed form in Red  team folder to be completed by clinical staff.  Grayce Fredda Hammed

## 2021-10-09 NOTE — Telephone Encounter (Signed)
Placed in MDs box to be filled out. Wilbert Hayashi, CMA  

## 2021-10-11 ENCOUNTER — Telehealth: Payer: Self-pay | Admitting: Family Medicine

## 2021-10-11 NOTE — Telephone Encounter (Signed)
Attempted to call patient about positive antibody screen and dating based on ultrasound.  Left voicemail with Swahili speaking interpreter.

## 2021-10-11 NOTE — Telephone Encounter (Signed)
Form completed. Form does not have patient signature, demographic information, or tax ID of clinic. Please add tax ID number.Reviewed, completed, and signed form.  Note routed to RN team inbasket and placed completed form in Clinic RN's office (wall pocket above desk).  Westley Chandler, MD

## 2021-10-12 ENCOUNTER — Other Ambulatory Visit: Payer: Self-pay

## 2021-10-12 ENCOUNTER — Ambulatory Visit (HOSPITAL_COMMUNITY)
Admission: EM | Admit: 2021-10-12 | Discharge: 2021-10-12 | Disposition: A | Discharge status: Home / Self Care | Payer: Medicaid Other | Attending: Family Medicine | Admitting: Family Medicine

## 2021-10-12 ENCOUNTER — Encounter (HOSPITAL_COMMUNITY): Payer: Self-pay | Admitting: Emergency Medicine

## 2021-10-12 DIAGNOSIS — O210 Mild hyperemesis gravidarum: Secondary | ICD-10-CM | POA: Diagnosis not present

## 2021-10-12 DIAGNOSIS — O219 Vomiting of pregnancy, unspecified: Secondary | ICD-10-CM

## 2021-10-12 DIAGNOSIS — Z3A00 Weeks of gestation of pregnancy not specified: Secondary | ICD-10-CM

## 2021-10-12 MED ORDER — DOXYLAMINE-PYRIDOXINE 10-10 MG PO TBEC: DELAYED_RELEASE_TABLET | ORAL | 3 refills | Status: DC

## 2021-10-12 NOTE — ED Triage Notes (Signed)
Pt presents with loss of appetite and vomiting xs 1-2 weeks.

## 2021-10-13 ENCOUNTER — Encounter (HOSPITAL_COMMUNITY): Payer: Self-pay

## 2021-10-13 ENCOUNTER — Telehealth: Payer: Self-pay

## 2021-10-13 NOTE — Telephone Encounter (Signed)
Attempted to call patient to inform of form ready for pick up, however no answer. Generic message to call [facility].   Form is ready for pick up if she calls back.  A copy has been scanned into chart.

## 2021-10-13 NOTE — Telephone Encounter (Signed)
Contacted patient to follow up after ED visit. Patient went to ER to request medication refill after her kids spilt her education.Patient instructed to call me for non emergencies instead of going to ER. She verbalized understanding. Also advised to keep all medication out of children`s reach.   Nicole Cella Corissa Oguinn RN BSn PCCN  [facility] [phone]-cell [phone]-office

## 2021-10-14 NOTE — ED Provider Notes (Signed)
[facility] CARE CENTER   [id number] 10/12/21 Arrival Time: 1026  ASSESSMENT & PLAN:  1. Nausea and vomiting during pregnancy    No pregnancy complications reported. U/A normal.  Meds ordered this encounter  Medications   Doxylamine-Pyridoxine (DICLEGIS) 10-10 MG TBEC    Sig: Take every night for 3 days then increase to 1 in the morning and 1 at night    Dispense:  90 tablet    Refill:  3   Recommend routine f/u with OB.   Follow-up Information     [facility] EMERGENCY DEPARTMENT.   Specialty: Emergency Medicine Why: If symptoms worsen in any way. Contact information: [address] [id number] mc [place] [address] [phone]                 Reviewed expectations re: course of current medical issues. Questions answered. Outlined signs and symptoms indicating need for more acute intervention. Patient verbalized understanding. After Visit Summary given.   SUBJECTIVE: History from: patient. Kiswahili interpreter.  Katelyn Sharp is a 30 y.o. female who presents with complaint of vomiting with pregnancy.  OB History     Gravida  5   Para  4   Term  4   Preterm  0   AB  0   Living  4      SAB  0   IAB  0   Ectopic  0   Multiple  0   Live Births  1        Obstetric Comments  G1- 2014- SVD of boy, 2.5 kg- had episiotomy in G1   G2- 2016- [name], SVD, girl, 2.5 kg  G3- [name]- 2018- SVD, 2.5 kg         Patient's last menstrual period was 08/15/2021 (exact date).  No reported complications.  Out of nausea medications. No abd pain.  Past Surgical History:  Procedure Laterality Date   NO PAST SURGERIES     PERINEAL LACERATION REPAIR     G1 pregnancy     OBJECTIVE:  Vitals:   10/12/21 1222  BP: 121/83  Pulse: 76  Resp: 16  Temp: 98.5 F (36.9 C)  TempSrc: Oral  SpO2: 99%    General appearance: alert; no distress Oropharynx: moist Lungs: clear to auscultation bilaterally;  unlabored Heart: regular rate and rhythm Abdomen: soft; benign Back: no CVA tenderness Extremities: no edema; symmetrical with no gross deformities Skin: warm; dry Neurologic: normal gait Psychological: alert and cooperative; normal mood and affect  Labs:  Labs Reviewed  POCT URINALYSIS DIPSTICK, ED / UC    No Known Allergies                                             Past Medical History:  Diagnosis Date   Essential hypertension    Type 2 diabetes mellitus (HCC)    Social History   Socioeconomic History   Marital status: Married    Spouse name: Not on file   Number of children: Not on file   Years of education: Not on file   Highest education level: Not on file  Occupational History   Not on file  Tobacco Use   Smoking status: Never   Smokeless tobacco: Never  Substance and Sexual Activity   Alcohol use: Never   Drug use: Never   Sexual activity: Yes    Partners: Male  Birth control/protection: None  Other Topics Concern   Not on file  Social History Narrative   Refugee Information   Number of Immediate Family Members: 4   Number of Immediate Family Members in [place]: 4   Date of Arrival: 06/28/19   Country of Birth: [facility]   Country of Origin: [facility]   Location of Refugee Camp: [place]   Duration in [place]: 20 years or greater   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, occupational   Primary Language: Swahili/Kiswahili   Able to Read in Primary Language: Yes   Able to Write in Primary Language: Yes   Education: [employer]   Prior Work: Worked at home   Marital Status: Married   Sexual Activity: Yes   Tuberculosis Screening Health Department: Not Completed   Health Department Labs Completed: No(obtaining today)   History of Trauma: None   Do You Feel Jumpy or Nervous?: No   Are You Very Watchful or 'Super Alert'?: No   Social Determinants of Health   Financial Resource Strain: Not on file  Food Insecurity: Not on file  Transportation Needs: Not on file   Physical Activity: Not on file  Stress: Not on file  Social Connections: Not on file  Intimate Partner Violence: Not on file   Family History  Problem Relation Age of Onset   Healthy Mother    Healthy Father    Bleeding Disorder Neg Hx    Hypertension Neg Hx    Diabetes Neg Hx       Mardella Layman, MD 10/14/21 1024

## 2021-10-15 ENCOUNTER — Telehealth: Payer: Self-pay | Admitting: Family Medicine

## 2021-10-15 NOTE — Telephone Encounter (Signed)
Called with Swahili  interepreter. Unable to reach patient about ultrasound and positive antibody screen. Will send letter.  Has ob follow up January. Reminder to self to ensure she follows up.  Terisa Starr, MD  Family Medicine Teaching Service

## 2021-10-21 ENCOUNTER — Telehealth: Payer: Self-pay

## 2021-10-21 NOTE — Telephone Encounter (Signed)
Unable to sort out work related paperwork over the phone. Appointment scheduled to meet with PCP to discuss work related paperwork and discuss lab test results.  Nicole Cella Jaclyn Carew RN BSn PCCN  [facility] [phone]-cell [phone]-office

## 2021-10-25 NOTE — L&D Delivery Note (Signed)
OB/GYN Faculty Practice Delivery Note  Katelyn Sharp is a 31 y.o. [id number] s/p SVD at [redacted]w[redacted]d. She was admitted for IOL due to [facility] and T2DM.   ROM: 4h 72m with clear fluid GBS Status: Negative   Delivery Date/Time: 04/07/22 at 0531   Delivery: Called to room and patient was pushing/crowning. Head delivered direct occiput anterior. Nuchal cord present and reduced. Shoulders and body delivered in usual fashion. Infant with spontaneous cry, placed on mother's abdomen, dried and stimulated. Cord clamped x 2 after 1-minute delay and cut. Cord blood drawn. Placenta delivered spontaneously with gentle cord traction. Fundus firm with massage and Pitocin. TXA given. Labia, perineum, vagina, and cervix were inspected, and patient was found to have a small 1st degree perineal laceration that was hemostatic and not repaired.   Placenta: Intact, 3VC - sent to L&D Complications: None  Lacerations: 1st degree perineal  EBL: 50 cc Analgesia: None   Infant: Viable female  APGARs 7 and 78  Evalina Field, MD OB/GYN Fellow, Faculty Practice

## 2021-11-05 ENCOUNTER — Other Ambulatory Visit (HOSPITAL_COMMUNITY)
Admission: RE | Admit: 2021-11-05 | Discharge: 2021-11-05 | Disposition: A | Discharge status: Home / Self Care | Payer: Medicaid Other | Source: Ambulatory Visit | Attending: Obstetrics and Gynecology | Admitting: Obstetrics and Gynecology

## 2021-11-05 ENCOUNTER — Ambulatory Visit (INDEPENDENT_AMBULATORY_CARE_PROVIDER_SITE_OTHER): Payer: Medicaid Other | Admitting: Obstetrics and Gynecology

## 2021-11-05 ENCOUNTER — Other Ambulatory Visit: Payer: Self-pay

## 2021-11-05 ENCOUNTER — Encounter: Payer: Self-pay | Admitting: Obstetrics and Gynecology

## 2021-11-05 VITALS — BP 119/67 | HR 86 | Wt 171.5 lb

## 2021-11-05 DIAGNOSIS — O0990 Supervision of high risk pregnancy, unspecified, unspecified trimester: Secondary | ICD-10-CM | POA: Diagnosis not present

## 2021-11-05 DIAGNOSIS — O361920 Maternal care for other isoimmunization, second trimester, not applicable or unspecified: Secondary | ICD-10-CM

## 2021-11-05 DIAGNOSIS — O24112 Pre-existing diabetes mellitus, type 2, in pregnancy, second trimester: Secondary | ICD-10-CM | POA: Insufficient documentation

## 2021-11-05 DIAGNOSIS — O24119 Pre-existing diabetes mellitus, type 2, in pregnancy, unspecified trimester: Secondary | ICD-10-CM | POA: Insufficient documentation

## 2021-11-05 DIAGNOSIS — O10919 Unspecified pre-existing hypertension complicating pregnancy, unspecified trimester: Secondary | ICD-10-CM | POA: Insufficient documentation

## 2021-11-05 DIAGNOSIS — Z31430 Encounter of female for testing for genetic disease carrier status for procreative management: Secondary | ICD-10-CM | POA: Diagnosis not present

## 2021-11-05 DIAGNOSIS — Z789 Other specified health status: Secondary | ICD-10-CM

## 2021-11-05 MED ORDER — ASPIRIN EC 81 MG PO TBEC: 81.0000 mg | DELAYED_RELEASE_TABLET | Freq: Every day | ORAL | 1 refills | Status: DC

## 2021-11-05 MED ORDER — BLOOD PRESSURE KIT DEVI: 1.0000 | 0 refills | Status: DC

## 2021-11-05 NOTE — Progress Notes (Signed)
PRENATAL VISIT NOTE  Transfer of care visit from Family Medicine  Subjective:  Katelyn Sharp is a 31 y.o. G5P4004 at [redacted]w[redacted]d being seen today for ongoing prenatal care.  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  Patient reports no complaints.  Patient denies any breast s/s. Contractions: Not present. Vag. Bleeding: None.  Movement: Absent. Denies leaking of fluid.   The following portions of the patient's history were reviewed and updated as appropriate: allergies, current medications, past family history, past medical history, past social history, past surgical history and problem list.   Objective:   Vitals:   11/05/21 0948  BP: 119/67  Pulse: 86  Weight: 171 lb 8 oz (77.8 kg)    Fetal Status: Fetal Heart Rate (bpm): 161   Movement: Absent     General:  Alert, oriented and cooperative. Patient is in no acute distress.  Skin: Skin is warm and dry. No rash noted.   Cardiovascular: Normal heart rate noted. Normal s1 and s2, no MRGs  Respiratory: CTAB. Normal respiratory effort, no problems with respiration noted  Abdomen: Soft, gravid, appropriate for gestational age.  Pain/Pressure: Absent     Pelvic: Cervical exam performed in the presence of a chaperone Dilation: Closed Effacement (%): Thick    Extremities: Normal range of motion.     Mental Status: Normal mood and affect. Normal behavior. Normal judgment and thought content.   Assessment and Plan:  Pregnancy: [id number] at [redacted]w[redacted]d 1. Supervision of high risk pregnancy, antepartum Dates adjusted today from 12wks based on LMP to 16/6 today due to 12/15 u/s  showing her to be 12/6wks. New dating d/w her. Anatomy u/s moved up from March.  Pt amenable to starting low dose ASA. - Hemoglobin A1c - Genetic Screening - Cervicovaginal ancillary only( [place]) - TSH - Protein / creatinine ratio, urine - AFP, Serum, Open Spina Bifida - Glucose, random - Referral to Nutrition and Diabetes Services  2. Pre-existing type 2 diabetes mellitus during pregnancy in second trimester 08/04/21 a1c 6.3 and pt no on meds when she got pregnant. She states she does not know how to do CBGs. Referral to DM education made and I told her that in pregnancy, BS control is tighter and she'll need to check it a few times a day for maternal/fetal health and well being. F/u a1c today and after MFM anatomy u/s to see if fetal echo is needed - Hemoglobin A1c - Genetic Screening - Cervicovaginal ancillary only( [place]) - TSH - Protein / creatinine ratio, urine - AFP, Serum, Open Spina Bifida - Glucose, random - Referral to Nutrition and Diabetes Services  3. Chronic hypertension during pregnancy, antepartum Doing well on no meds  4. 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 No h/o HDFN. Recheck at 28wks  5. Language barrier Interpreter useed  Preterm labor symptoms and general obstetric precautions including but not limited to vaginal bleeding, contractions, leaking of fluid and fetal movement were reviewed in detail with the patient. Please refer to After Visit Summary for other counseling recommendations.   Return in about 2 weeks (around 11/19/2021) for in person, md visit, high risk ob.  Future Appointments  Date Time  Provider Department Center  11/09/2021  9:10 AM Martyn Malay, MD [facility]  11/19/2021  1:15 PM [facility]  11/19/2021  2:55 PM Donnamae Jude, MD [facility]  11/20/2021  8:15 AM [facility] NURSE [facility]  11/20/2021  8:30 AM [facility] US3 [facility]  12/28/2021  9:30  AM [facility] NURSE [facility]    [name], MD

## 2021-11-05 NOTE — Patient Instructions (Signed)
Your Due Date is April 16, 2022 Today you are 16 weeks and 6 days

## 2021-11-06 ENCOUNTER — Encounter: Payer: Self-pay | Admitting: *Deleted

## 2021-11-06 LAB — CERVICOVAGINAL ANCILLARY ONLY
Chlamydia: NEGATIVE
Comment: NEGATIVE
Comment: NORMAL
Neisseria Gonorrhea: NEGATIVE

## 2021-11-06 LAB — PROTEIN / CREATININE RATIO, URINE
Creatinine, Urine: 389.2 mg/dL
Protein, Ur: 25.2 mg/dL
Protein/Creat Ratio: 65 mg/g creat (ref 0–200)

## 2021-11-07 LAB — HEMOGLOBIN A1C
Est. average glucose Bld gHb Est-mCnc: 126 mg/dL
Hgb A1c MFr Bld: 6 % — ABNORMAL HIGH (ref 4.8–5.6)

## 2021-11-07 LAB — AFP, SERUM, OPEN SPINA BIFIDA
AFP MoM: 0.73
AFP Value: 27.9 ng/mL
Gest. Age on Collection Date: 16.6 weeks
Maternal Age At EDD: 31.4 yr
OSBR Risk 1 IN: 10000
Test Results:: NEGATIVE
Weight: 171 [lb_av]

## 2021-11-07 LAB — GLUCOSE, RANDOM: Glucose: 145 mg/dL — ABNORMAL HIGH (ref 70–99)

## 2021-11-07 LAB — TSH: TSH: 2.56 u[IU]/mL (ref 0.450–4.500)

## 2021-11-09 ENCOUNTER — Other Ambulatory Visit: Payer: Self-pay

## 2021-11-09 ENCOUNTER — Encounter: Payer: Self-pay | Admitting: Family Medicine

## 2021-11-09 ENCOUNTER — Ambulatory Visit: Payer: Medicaid Other | Admitting: Family Medicine

## 2021-11-09 DIAGNOSIS — O0990 Supervision of high risk pregnancy, unspecified, unspecified trimester: Secondary | ICD-10-CM

## 2021-11-09 DIAGNOSIS — Z3480 Encounter for supervision of other normal pregnancy, unspecified trimester: Secondary | ICD-10-CM | POA: Diagnosis not present

## 2021-11-09 MED ORDER — ASPIRIN EC 81 MG PO TBEC: 81.0000 mg | DELAYED_RELEASE_TABLET | Freq: Every day | ORAL | 1 refills | Status: DC

## 2021-11-09 MED ORDER — PRENATAL VITAMIN 27-0.8 MG PO TABS
1.0000 | ORAL_TABLET | Freq: Every day | ORAL | 3 refills | Status: DC
Start: 2021-11-09 — End: 2022-05-08

## 2021-11-09 NOTE — Patient Instructions (Addendum)
It was wonderful to see you today.  Please bring ALL of your medications with you to every visit.   Today we talked about:  - Taking the form and letter to your appointment   -- Continue to take your aspirin and prenatal  -- You have follow up with the Obstetricians    - [place] fomu na barua kwa miadi yako  -- Raelene Bott aspirini yako na kabla ya [place]  -- [place] wa Leonidas Romberg  Thank you for choosing [facility].   Please call [phone] with any questions about today's appointment.  Please be sure to schedule follow up at the front  desk before you leave today.   Terisa Starr, MD  Family Medicine

## 2021-11-09 NOTE — Progress Notes (Signed)
° ° °  SUBJECTIVE:   CHIEF COMPLAINT: form  HPI:  The patient speaks Swahili as their primary language.  An interpreter was used for the entire visit.   Margarethe Wheeling is a 31 y.o.  [id number] at [redacted]w[redacted]d presenting with forms today. She currently works at [employer]. She reports her nausea, vomiting, and dizziness have now improved. She feels can can now work at heights of 8 feet without issue. Denies vaginal bleeding, discharge, headaches. She is nearly out of prenatal vitamins.   PERTINENT  PMH / PSH/Family/Social History : Noted-- followed by [facility], has anti M antibody, hypertension, type 2 diabetes   OBJECTIVE:   BP 110/60    Pulse 73    Ht 5\' 4"  (1.626 m)    Wt 175 lb (79.4 kg)    LMP 08/15/2021 (Exact Date)    SpO2 98%    BMI 30.04 kg/m   Today's weight:  Last Weight  Most recent update: 11/09/2021  8:55 AM    Weight  79.4 kg (175 lb)            Review of prior weights: 11/11/2021   11/09/21 0855  Weight: 175 lb (79.4 kg)    Cardiac: Warm well perfused.  Capillary refill less than 3 seconds Respiratory breathing comfortably on room air Psych: Pleasant normal affect, appropriate, normal rate of speech FHT: 140 bpm   ASSESSMENT/PLAN:   Supervision of high risk pregnancy, antepartum Nausea, vomiting and first trimester symptoms improved. Form completed. Weight is stable, Blood pressure and fetal heart rate appropriate. She will follow up with [facility] for her history of type 2 diabetes in the setting of her pregnancy.  Refilled prenatal vitamin.      11/11/21, MD  Family Medicine Teaching Service  [facility]

## 2021-11-09 NOTE — Assessment & Plan Note (Signed)
Nausea, vomiting and first trimester symptoms improved. Form completed. Weight is stable, Blood pressure and fetal heart rate appropriate. She will follow up with [facility] for her history of type 2 diabetes in the setting of her pregnancy.

## 2021-11-18 ENCOUNTER — Encounter: Payer: Self-pay | Admitting: *Deleted

## 2021-11-19 ENCOUNTER — Encounter: Payer: Medicaid Other | Attending: Obstetrics and Gynecology | Admitting: Registered"

## 2021-11-19 ENCOUNTER — Ambulatory Visit (INDEPENDENT_AMBULATORY_CARE_PROVIDER_SITE_OTHER): Payer: Medicaid Other | Admitting: Family Medicine

## 2021-11-19 ENCOUNTER — Other Ambulatory Visit: Payer: Self-pay

## 2021-11-19 ENCOUNTER — Ambulatory Visit (INDEPENDENT_AMBULATORY_CARE_PROVIDER_SITE_OTHER): Payer: Medicaid Other | Admitting: Registered"

## 2021-11-19 ENCOUNTER — Other Ambulatory Visit: Payer: Self-pay | Admitting: Lactation Services

## 2021-11-19 VITALS — BP 108/72 | HR 73 | Wt 174.3 lb

## 2021-11-19 DIAGNOSIS — O361920 Maternal care for other isoimmunization, second trimester, not applicable or unspecified: Secondary | ICD-10-CM

## 2021-11-19 DIAGNOSIS — O24112 Pre-existing diabetes mellitus, type 2, in pregnancy, second trimester: Secondary | ICD-10-CM

## 2021-11-19 DIAGNOSIS — O10919 Unspecified pre-existing hypertension complicating pregnancy, unspecified trimester: Secondary | ICD-10-CM

## 2021-11-19 DIAGNOSIS — Z3A18 18 weeks gestation of pregnancy: Secondary | ICD-10-CM

## 2021-11-19 DIAGNOSIS — O0990 Supervision of high risk pregnancy, unspecified, unspecified trimester: Secondary | ICD-10-CM | POA: Insufficient documentation

## 2021-11-19 DIAGNOSIS — Z3A00 Weeks of gestation of pregnancy not specified: Secondary | ICD-10-CM | POA: Insufficient documentation

## 2021-11-19 DIAGNOSIS — Z789 Other specified health status: Secondary | ICD-10-CM

## 2021-11-19 MED ORDER — ACCU-CHEK SOFTCLIX LANCETS MISC: 12 refills | Status: DC

## 2021-11-19 MED ORDER — ACCU-CHEK GUIDE VI STRP: ORAL_STRIP | 12 refills | Status: DC

## 2021-11-19 MED ORDER — ASPIRIN EC 81 MG PO TBEC: 81.0000 mg | DELAYED_RELEASE_TABLET | Freq: Every day | ORAL | 1 refills | Status: DC

## 2021-11-19 NOTE — Progress Notes (Signed)
° °  PRENATAL VISIT NOTE  Subjective:  Katelyn Sharp is a 31 y.o. [id number] at [redacted]w[redacted]d being seen today for ongoing prenatal care.  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  Patient reports no complaints.  Contractions: Not present. Vag. Bleeding: None.  Movement: Absent. Denies leaking of fluid.   The following portions of the patient's history were reviewed and updated as appropriate: allergies, current medications, past family history, past medical history, past social history, past surgical history and problem list.   Objective:   Vitals:   11/19/21 1451  BP: 108/72  Pulse: 73  Weight: 174 lb 4.8 oz (79.1 kg)    Fetal Status: Fetal Heart Rate (bpm): 155   Movement: Absent     General:  Alert, oriented and cooperative. Patient is in no acute distress.  Skin: Skin is warm and dry. No rash noted.   Cardiovascular: Normal heart rate noted  Respiratory: Normal respiratory effort, no problems with respiration noted  Abdomen: Soft, gravid, appropriate for gestational age.  Pain/Pressure: Absent     Pelvic: Cervical exam deferred        Extremities: Normal range of motion.  Edema: None  Mental Status: Normal mood and affect. Normal behavior. Normal judgment and thought content.   Assessment and Plan:  Pregnancy: [id number] at [redacted]w[redacted]d 1. Chronic hypertension during pregnancy, antepartum BP is well controlled on no meds On ASA - aspirin EC 81 MG tablet; Take 1 tablet (81 mg total) by mouth daily.  Dispense: 60 tablet; Refill: 1  2. Pre-existing type 2 diabetes mellitus during pregnancy in second trimester On ASA, no meds  presently, with A1C at 6-->met with diabetes education today To bring log next visit for decision about meds - aspirin EC 81 MG tablet; Take 1 tablet (81 mg total) by mouth daily.  Dispense: 60 tablet; Refill: 1 - Ambulatory referral to Ophthalmology - Ambulatory referral to Pediatric Cardiology  3. 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 Language barrier Swahili interpreter: in person used   5. Supervision of high risk pregnancy, antepartum Continue prenatal care.   Preterm labor symptoms and general obstetric precautions including but not limited to vaginal bleeding, contractions, leaking of fluid and fetal movement were reviewed in detail with the patient. Please refer to After Visit Summary for other counseling recommendations.   Return in 2 weeks (on 12/03/2021).  Future Appointments  Date Time Provider [place]  11/20/2021  8:15 AM [facility] NURSE [facility]  11/20/2021  8:30 AM [facility] US3 [facility]  12/07/2021  2:15 PM Anyanwu, Sallyanne Havers, MD [facility]  12/28/2021  9:30 AM [facility] NURSE [facility]    Donnamae Jude, MD

## 2021-11-19 NOTE — Progress Notes (Signed)
Accu Chek supplies ordered per Angela Johnston, RD 

## 2021-11-19 NOTE — Progress Notes (Signed)
In-person interpreter Jocelyne from [facility]  Patient was seen for Gestational Diabetes self-management on 11/19/21  Start time 1315 and End time 1420   Estimated due date: 04/16/22; [redacted]w[redacted]d  Clinical: Medications: prenatal, aspirin Medical History: GDM previous pregnancy, controlled with medication Labs: OGTT n/a, A1c 6.0%   Dietary and Lifestyle History:  Pt reports she did not check blood sugar last pregnancy, only was given medication.   Pt states that checking 4x a day is going to be hard. Discussed that because she needed medication last pregnancy, it is likely will need it again and insulin is the preferred medication. Pt reports that she believes oral medication will control blood sugar and does not want to use insulin. I told her that it was not being prescribed now I just wanted to let her know I would be teaching her how to use it in the future if the doctors decided it was needed.   Physical Activity: not assessed Stress: not assessed Sleep: not assessed  24 hr Recall: not assessed   NUTRITION INTERVENTION  Nutrition education (E-1) on the following topics:   Initial Follow-up  [x]  []  Definition of Gestational Diabetes [x]  []  Why dietary management is important in controlling blood glucose [x]  []  Effects each nutrient has on blood glucose levels []  []  Simple carbohydrates vs complex carbohydrates []  []  Fluid intake [x]  []  Creating a balanced meal plan [x]  []  Carbohydrate counting  [x]  []  When to check blood glucose levels [x]  []  Proper blood glucose monitoring techniques []  []  Effect of stress and stress reduction techniques  [x]  []  Exercise effect on blood glucose levels, appropriate exercise during pregnancy []  []  Importance of limiting caffeine and abstaining from alcohol and smoking [x]  []  Medications used for blood sugar control during pregnancy []  []  Hypoglycemia and rule of 15 []  []  Postpartum self care  Blood glucose monitor given: Accu-chek Guide  Me Lot [id number] Exp: 12/04/2022 CBG: 93 mg/dL   Patient instructed to monitor glucose levels: FBS: 60 - ? 95 mg/dL (some clinics use 90 for cutoff) 1 hour: ? 140 mg/dL 2 hour: ? 120 mg/dL  Patient received handouts: Nutrition Diabetes and Pregnancy Carbohydrate Counting List  Patient will be seen for follow-up as needed.

## 2021-11-19 NOTE — Progress Notes (Signed)
Pt needing ASA refill.

## 2021-11-20 ENCOUNTER — Ambulatory Visit: Payer: Medicaid Other

## 2021-11-20 ENCOUNTER — Ambulatory Visit: Payer: Medicaid Other | Attending: Obstetrics and Gynecology

## 2021-11-20 ENCOUNTER — Ambulatory Visit: Payer: Medicaid Other | Admitting: *Deleted

## 2021-11-20 ENCOUNTER — Encounter: Payer: Self-pay | Admitting: *Deleted

## 2021-11-20 ENCOUNTER — Other Ambulatory Visit: Payer: Self-pay | Admitting: *Deleted

## 2021-11-20 VITALS — BP 118/58 | HR 74

## 2021-11-20 DIAGNOSIS — Z3A19 19 weeks gestation of pregnancy: Secondary | ICD-10-CM | POA: Diagnosis not present

## 2021-11-20 DIAGNOSIS — Z3689 Encounter for other specified antenatal screening: Secondary | ICD-10-CM | POA: Diagnosis not present

## 2021-11-20 DIAGNOSIS — O24112 Pre-existing diabetes mellitus, type 2, in pregnancy, second trimester: Secondary | ICD-10-CM | POA: Diagnosis not present

## 2021-11-20 DIAGNOSIS — O361920 Maternal care for other isoimmunization, second trimester, not applicable or unspecified: Secondary | ICD-10-CM

## 2021-11-20 DIAGNOSIS — O10012 Pre-existing essential hypertension complicating pregnancy, second trimester: Secondary | ICD-10-CM

## 2021-11-20 DIAGNOSIS — O0990 Supervision of high risk pregnancy, unspecified, unspecified trimester: Secondary | ICD-10-CM | POA: Diagnosis not present

## 2021-11-20 DIAGNOSIS — O10912 Unspecified pre-existing hypertension complicating pregnancy, second trimester: Secondary | ICD-10-CM

## 2021-11-25 ENCOUNTER — Other Ambulatory Visit: Payer: Self-pay

## 2021-12-07 ENCOUNTER — Encounter: Payer: Medicaid Other | Admitting: Obstetrics & Gynecology

## 2021-12-10 ENCOUNTER — Other Ambulatory Visit: Payer: Self-pay

## 2021-12-10 ENCOUNTER — Ambulatory Visit (INDEPENDENT_AMBULATORY_CARE_PROVIDER_SITE_OTHER): Payer: Medicaid Other | Admitting: Obstetrics and Gynecology

## 2021-12-10 ENCOUNTER — Encounter: Payer: Self-pay | Admitting: Obstetrics and Gynecology

## 2021-12-10 ENCOUNTER — Encounter: Payer: Self-pay | Admitting: Registered"

## 2021-12-10 VITALS — BP 124/63 | HR 81 | Wt 171.0 lb

## 2021-12-10 DIAGNOSIS — Z789 Other specified health status: Secondary | ICD-10-CM

## 2021-12-10 DIAGNOSIS — O0990 Supervision of high risk pregnancy, unspecified, unspecified trimester: Secondary | ICD-10-CM | POA: Diagnosis not present

## 2021-12-10 DIAGNOSIS — O361920 Maternal care for other isoimmunization, second trimester, not applicable or unspecified: Secondary | ICD-10-CM

## 2021-12-10 DIAGNOSIS — O10919 Unspecified pre-existing hypertension complicating pregnancy, unspecified trimester: Secondary | ICD-10-CM | POA: Diagnosis not present

## 2021-12-10 DIAGNOSIS — Z758 Other problems related to medical facilities and other health care: Secondary | ICD-10-CM

## 2021-12-10 DIAGNOSIS — O24112 Pre-existing diabetes mellitus, type 2, in pregnancy, second trimester: Secondary | ICD-10-CM | POA: Diagnosis not present

## 2021-12-10 LAB — GLUCOSE, CAPILLARY: Glucose-Capillary: 114 mg/dL — ABNORMAL HIGH (ref 70–99)

## 2021-12-10 MED ORDER — METFORMIN HCL 500 MG PO TABS: 500.0000 mg | ORAL_TABLET | Freq: Two times a day (BID) | ORAL | 5 refills | Status: DC

## 2021-12-10 NOTE — Progress Notes (Signed)
Pt reported that there was an issue with her glucometer and RD went into patient's room to investigate issue. Patient did not have meter at appointment but we were able to figure out the problem was that the patient was applying blood to the strip before inserting into the meter. Patient repeated back correct way to test blood sugar, placing the strip in the meter and with the meter on then apply blood to the strip. Pt had no other questions.

## 2021-12-10 NOTE — Progress Notes (Signed)
Subjective:  Katelyn Sharp is a 31 y.o. G5P4004 at [redacted]w[redacted]d being seen today for ongoing prenatal care.  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  Patient reports no complaints.  Contractions: Not present.  .  Movement: Present. Denies leaking of fluid.   The following portions of the patient's history were reviewed and updated as appropriate: allergies, current medications, past family history, past medical history, past social history, past surgical history and problem list. Problem list updated.  Objective:   Vitals:   12/10/21 0823  BP: 124/63  Pulse: 81  Weight: 171 lb (77.6 kg)    Fetal Status: Fetal Heart Rate (bpm): 147   Movement: Present     General:  Alert, oriented and cooperative. Patient is in no acute distress.  Skin: Skin is warm and dry. No rash noted.   Cardiovascular: Normal heart rate noted  Respiratory: Normal respiratory effort, no problems with respiration noted  Abdomen: Soft, gravid, appropriate for gestational age. Pain/Pressure: Absent     Pelvic:  Cervical exam deferred        Extremities: Normal range of motion.     Mental Status: Normal mood and affect. Normal behavior. Normal judgment and thought content.   Urinalysis:      Assessment and Plan:  Pregnancy: [id number] at [redacted]w[redacted]d  1. Supervision of high risk pregnancy, antepartum Stable   2. Chronic hypertension during pregnancy, antepartum BP stable without meds Continue with qd BASA Serial growth scans and antenatal testing as per protocol.  3. Pre-existing type 2 diabetes mellitus during pregnancy in second trimester Has not been  checking CBG's  Saw Levada Dy today who reviewed how to check CBG's with pt and husband Fasting CBG in office today, 114 Will start Metformin. Indications reviewed with pt Serial growth scans and antenatal testing as per protocol. - metFORMIN (GLUCOPHAGE) 500 MG tablet; Take 1 tablet (500 mg total) by mouth 2 (two) times daily with a meal.  Dispense: 60 tablet; Refill: 5 Referrals for ECHO and opth made 4. 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 Language barrier Live interrupter used during today's visit  Preterm labor symptoms and general obstetric precautions including but not limited to vaginal bleeding, contractions, leaking of fluid and fetal movement were reviewed in detail with the patient. Please refer to After Visit Summary for other counseling recommendations.  Return in about 2 weeks (around 12/24/2021) for OB visit, face to face, MD only.   [name], MD

## 2021-12-10 NOTE — Progress Notes (Signed)
Patient reports not checking her sugars due to her having trouble using her glucose machine

## 2021-12-10 NOTE — Patient Instructions (Signed)

## 2021-12-18 ENCOUNTER — Ambulatory Visit: Payer: Medicaid Other | Attending: Obstetrics and Gynecology

## 2021-12-18 ENCOUNTER — Encounter: Payer: Self-pay | Admitting: *Deleted

## 2021-12-18 ENCOUNTER — Ambulatory Visit: Payer: Medicaid Other | Admitting: *Deleted

## 2021-12-18 ENCOUNTER — Other Ambulatory Visit: Payer: Self-pay | Admitting: *Deleted

## 2021-12-18 ENCOUNTER — Other Ambulatory Visit: Payer: Self-pay

## 2021-12-18 VITALS — BP 121/67 | HR 82

## 2021-12-18 DIAGNOSIS — O0990 Supervision of high risk pregnancy, unspecified, unspecified trimester: Secondary | ICD-10-CM | POA: Diagnosis not present

## 2021-12-18 DIAGNOSIS — Z3A23 23 weeks gestation of pregnancy: Secondary | ICD-10-CM | POA: Diagnosis not present

## 2021-12-18 DIAGNOSIS — O10912 Unspecified pre-existing hypertension complicating pregnancy, second trimester: Secondary | ICD-10-CM

## 2021-12-18 DIAGNOSIS — O283 Abnormal ultrasonic finding on antenatal screening of mother: Secondary | ICD-10-CM

## 2021-12-18 DIAGNOSIS — O24112 Pre-existing diabetes mellitus, type 2, in pregnancy, second trimester: Secondary | ICD-10-CM

## 2021-12-18 DIAGNOSIS — O361920 Maternal care for other isoimmunization, second trimester, not applicable or unspecified: Secondary | ICD-10-CM

## 2021-12-18 DIAGNOSIS — O24012 Pre-existing diabetes mellitus, type 1, in pregnancy, second trimester: Secondary | ICD-10-CM

## 2021-12-18 DIAGNOSIS — O10012 Pre-existing essential hypertension complicating pregnancy, second trimester: Secondary | ICD-10-CM | POA: Diagnosis not present

## 2021-12-24 ENCOUNTER — Ambulatory Visit (INDEPENDENT_AMBULATORY_CARE_PROVIDER_SITE_OTHER): Payer: Medicaid Other | Admitting: Family Medicine

## 2021-12-24 ENCOUNTER — Ambulatory Visit (INDEPENDENT_AMBULATORY_CARE_PROVIDER_SITE_OTHER): Payer: Medicaid Other | Admitting: Registered"

## 2021-12-24 ENCOUNTER — Other Ambulatory Visit: Payer: Self-pay

## 2021-12-24 ENCOUNTER — Encounter: Payer: Medicaid Other | Attending: Obstetrics and Gynecology | Admitting: Registered"

## 2021-12-24 VITALS — BP 121/64 | HR 76 | Wt 175.0 lb

## 2021-12-24 DIAGNOSIS — O0990 Supervision of high risk pregnancy, unspecified, unspecified trimester: Secondary | ICD-10-CM | POA: Insufficient documentation

## 2021-12-24 DIAGNOSIS — O24112 Pre-existing diabetes mellitus, type 2, in pregnancy, second trimester: Secondary | ICD-10-CM | POA: Insufficient documentation

## 2021-12-24 DIAGNOSIS — Z3A00 Weeks of gestation of pregnancy not specified: Secondary | ICD-10-CM | POA: Insufficient documentation

## 2021-12-24 DIAGNOSIS — O361920 Maternal care for other isoimmunization, second trimester, not applicable or unspecified: Secondary | ICD-10-CM

## 2021-12-24 DIAGNOSIS — Z789 Other specified health status: Secondary | ICD-10-CM

## 2021-12-24 DIAGNOSIS — O10919 Unspecified pre-existing hypertension complicating pregnancy, unspecified trimester: Secondary | ICD-10-CM

## 2021-12-24 NOTE — Progress Notes (Signed)
? ?  PRENATAL VISIT NOTE ? ?Subjective:  ?Katelyn Sharp is a 31 y.o. G5P4004 at [redacted]w[redacted]d being seen today for ongoing prenatal care.  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 ? ?Patient reports no complaints.  Contractions: Not present. Vag. Bleeding: None.  Movement: Present. Denies leaking of fluid.  ? ?The following portions of the patient's history were reviewed and updated as appropriate: allergies, current medications, past family history, past medical history, past social history, past surgical history and problem list.  ? ?Objective:  ? ?Vitals:  ? 12/24/21 1503  ?BP: 121/64  ?Pulse: 76  ?Weight: 175 lb (79.4 kg)  ? ? ?Fetal Status: Fetal Heart Rate (bpm): 153   Movement: Present    ? ?General:  Alert, oriented and cooperative. Patient is in no acute distress.  ?Skin: Skin is warm and dry. No rash noted.   ?Cardiovascular: Normal heart rate noted  ?Respiratory: Normal respiratory effort, no problems with respiration noted  ?Abdomen: Soft, gravid, appropriate for gestational age.  Pain/Pressure: Absent     ?Pelvic: Cervical exam deferred        ?Extremities: Normal range of motion.     ?Mental Status: Normal mood and affect. Normal behavior. Normal judgment and thought content.  ? ?Assessment and Plan:  ?Pregnancy: [id number] at [redacted]w[redacted]d ?1. Chronic hypertension during pregnancy, antepartum ?BP is well contorlled on no meds ?On ASA ?Last growth at 33% ? ?2. Pre-existing type 2 diabetes mellitus during pregnancy in second trimester ?Few dietary indiscretions, see log-->continue Metformin ? ? ? ?3. Anti-M isoimmunization affecting pregnancy in  second trimester ?Repeat titer with 28 wk labs ? ?4. Language barrier ?Swahili interpreter: in person used ? ?5. Supervision of high risk pregnancy, antepartum ? ? ?Preterm labor symptoms and general obstetric precautions including but not limited to vaginal bleeding, contractions, leaking of fluid and fetal movement were reviewed in detail with the patient. ?Please refer to After Visit Summary for other counseling recommendations.  ? ?Return in about 3 weeks (around 01/14/2022) for [facility]. ? ?Future Appointments  ?Date Time Provider [place]  ?01/14/2022  3:55 PM Donnamae Jude, MD [facility]  ?01/22/2022 10:15 AM [facility] NURSE [facility]  ?01/22/2022 10:30 AM [facility] US2 [facility]  ? ? ?Donnamae Jude, MD ? ?

## 2021-12-24 NOTE — Progress Notes (Addendum)
In-person Swahili interpreter Theodore Demark from [facility] ? ?Patient was seen for Gestational Diabetes self-management on 12/24/21 ?Start time 1418 and End time 1445  ? ?Estimated due date: 04/16/22; [redacted]w[redacted]d ? ?Clinical: ?Medications: prenatal, aspirin, metformin 500 bid started 12/10/21 ?Medical History: GDM previous pregnancy, controlled with medication ?Labs: OGTT n/a, A1c 6.0%  ? ? ? ?Dietary and Lifestyle History: ?Yogurt 2x/week, used to eat with bread but because avoids bread now.  ? ?Pt states she eats fufu with most dinners and has been eating mangos in between meals. Today discussed portions of carbs and eating more protein. ? ?Pt states she is only drinking water. ? ?Physical Activity: not assessed ?Stress: not assessed ?Sleep: not assessed ? ?24 hr Recall: not assessed ? ? ?NUTRITION INTERVENTION  ?Nutrition education (E-1) on the following topics:  ? ?Initial Follow-up ? ?[x]  []  Definition of Gestational Diabetes ?[x]  []  Why dietary management is important in controlling blood glucose ?[x]  []  Effects each nutrient has on blood glucose levels ?[]  []  Simple carbohydrates vs complex carbohydrates ?[]  []  Fluid intake ?[x]  [x]  Creating a balanced meal plan ?[x]  [x]  Carbohydrate  (identifying carbs and protein) ?[x]  []  When to check blood glucose levels ?[x]  []  Proper blood glucose monitoring techniques ?[]  []  Effect of stress and stress reduction techniques  ?[x]  []  Exercise effect on blood glucose levels, appropriate exercise during pregnancy ?[]  []  Importance of limiting caffeine and abstaining from alcohol and smoking ?[x]  [x]  Medications used for blood sugar control during pregnancy (metformin not having and immediate effect on BG) ?[]  []  Hypoglycemia and rule of 15 ?[]  []  Postpartum self care ? ?Patient instructed to monitor glucose levels: ?FBS: 60 - ? 95 mg/dL (some clinics use 90 for cutoff) ?1 hour: ? 140 mg/dL ?2 hour: ? mg/dL ? ?Patient received handouts: ?Nutrition Diabetes and  Pregnancy ?Carbohydrate Counting List ? ?Patient will be seen for follow-up as needed.  ?

## 2021-12-28 ENCOUNTER — Ambulatory Visit: Payer: Medicaid Other

## 2021-12-31 ENCOUNTER — Other Ambulatory Visit: Payer: Self-pay

## 2021-12-31 DIAGNOSIS — Q250 Patent ductus arteriosus: Secondary | ICD-10-CM | POA: Diagnosis not present

## 2021-12-31 DIAGNOSIS — Q2112 Patent foramen ovale: Secondary | ICD-10-CM | POA: Diagnosis not present

## 2022-01-07 DIAGNOSIS — Z23 Encounter for immunization: Secondary | ICD-10-CM | POA: Diagnosis not present

## 2022-01-14 ENCOUNTER — Encounter: Payer: Self-pay | Admitting: Family Medicine

## 2022-01-14 ENCOUNTER — Encounter: Payer: Medicaid Other | Admitting: Family Medicine

## 2022-01-14 NOTE — Progress Notes (Signed)
Patient did not keep appointment today. She will be called to reschedule.  

## 2022-01-22 ENCOUNTER — Telehealth: Payer: Self-pay

## 2022-01-22 ENCOUNTER — Other Ambulatory Visit: Payer: Self-pay | Admitting: *Deleted

## 2022-01-22 ENCOUNTER — Ambulatory Visit: Payer: Medicaid Other | Admitting: *Deleted

## 2022-01-22 ENCOUNTER — Ambulatory Visit: Payer: Medicaid Other | Attending: Maternal & Fetal Medicine

## 2022-01-22 ENCOUNTER — Encounter: Payer: Self-pay | Admitting: *Deleted

## 2022-01-22 VITALS — BP 126/65 | HR 72

## 2022-01-22 DIAGNOSIS — O24113 Pre-existing diabetes mellitus, type 2, in pregnancy, third trimester: Secondary | ICD-10-CM

## 2022-01-22 DIAGNOSIS — O10013 Pre-existing essential hypertension complicating pregnancy, third trimester: Secondary | ICD-10-CM

## 2022-01-22 DIAGNOSIS — Z3A28 28 weeks gestation of pregnancy: Secondary | ICD-10-CM

## 2022-01-22 DIAGNOSIS — Z362 Encounter for other antenatal screening follow-up: Secondary | ICD-10-CM

## 2022-01-22 DIAGNOSIS — O283 Abnormal ultrasonic finding on antenatal screening of mother: Secondary | ICD-10-CM

## 2022-01-22 DIAGNOSIS — O361930 Maternal care for other isoimmunization, third trimester, not applicable or unspecified: Secondary | ICD-10-CM

## 2022-01-22 DIAGNOSIS — O358XX0 Maternal care for other (suspected) fetal abnormality and damage, not applicable or unspecified: Secondary | ICD-10-CM

## 2022-01-22 DIAGNOSIS — O0990 Supervision of high risk pregnancy, unspecified, unspecified trimester: Secondary | ICD-10-CM | POA: Diagnosis not present

## 2022-01-22 DIAGNOSIS — O24112 Pre-existing diabetes mellitus, type 2, in pregnancy, second trimester: Secondary | ICD-10-CM

## 2022-01-22 DIAGNOSIS — O280 Abnormal hematological finding on antenatal screening of mother: Secondary | ICD-10-CM | POA: Diagnosis not present

## 2022-01-22 DIAGNOSIS — O10912 Unspecified pre-existing hypertension complicating pregnancy, second trimester: Secondary | ICD-10-CM | POA: Diagnosis not present

## 2022-01-22 DIAGNOSIS — O10913 Unspecified pre-existing hypertension complicating pregnancy, third trimester: Secondary | ICD-10-CM

## 2022-01-22 DIAGNOSIS — O99213 Obesity complicating pregnancy, third trimester: Secondary | ICD-10-CM

## 2022-01-22 NOTE — Telephone Encounter (Signed)
Patient missed appointment with Dr Shawnie Pons, called to rescheduled but office is closed for the day. I will call again om monday. ? ?Arman Bogus RN BSn PCCN  ?[facility] ?[phone]-cell ?[phone]-office ? ? ?.  ?

## 2022-01-28 DIAGNOSIS — Z23 Encounter for immunization: Secondary | ICD-10-CM | POA: Diagnosis not present

## 2022-02-01 ENCOUNTER — Ambulatory Visit (INDEPENDENT_AMBULATORY_CARE_PROVIDER_SITE_OTHER): Payer: Medicaid Other | Admitting: Family Medicine

## 2022-02-01 VITALS — BP 119/65 | HR 89 | Wt 177.0 lb

## 2022-02-01 DIAGNOSIS — O0990 Supervision of high risk pregnancy, unspecified, unspecified trimester: Secondary | ICD-10-CM

## 2022-02-01 NOTE — Patient Instructions (Signed)
We have your office visit set up with obstetrics.  You should see that listed on the sheet.  If you develop any concerns in the meantime please let us or them know. ?

## 2022-02-01 NOTE — Progress Notes (Signed)
? ? ?  SUBJECTIVE:  ? ?CHIEF COMPLAINT / HPI:  ? ?Need for OB visit: ?Patient inadvertently scheduled an obstetric appointment with our office, however she follows with high risk obstetrics due to history of type 2 diabetes and chronic hypertension. Today she endorses feeling baby move, denies pain/pressure, denies contractions, denies vaginal bleeding or rush of fluid.  She has no further complaints. ? ?PERTINENT  PMH / PSH: Type 2 diabetes, chronic hypertension ? ?OBJECTIVE:  ? ?BP 119/65   Pulse 89   Wt 177 lb (80.3 kg)   LMP 08/15/2021 (Exact Date)   BMI 30.38 kg/m?   ? ?Fetal heart tone: 142 ?Fundal height: 29cm ? ?General: NAD, pleasant, able to participate in exam ?Respiratory: No respiratory distress ?Skin: warm and dry, no rashes noted ?Psych: Normal affect and mood ? ?ASSESSMENT/PLAN:  ?  ?Need for obstetric visit: ?Patient inadvertently schedule an office visit with our office, however follows with high risk obstetrics due to history of type 2 diabetes and chronic hypertension.  During the encounter baby's heart rate and fundal height are appropriate.  She denies any pain/pressure, contractions, vaginal bleeding, rupture of fluids.  States that she continues to feel baby move as normal.  She denies other concerns or complaints.  We did get her scheduled for an office visit with her high risk OB physician.  Discussed return precautions and reasons to go to the MAU.   ? ?Jackelyn Poling, DO ?[facility]  ? ? ? ?

## 2022-02-03 ENCOUNTER — Ambulatory Visit: Payer: Medicaid Other | Admitting: Family Medicine

## 2022-02-03 NOTE — Progress Notes (Deleted)
? ?  SUBJECTIVE:  ? ?CHIEF COMPLAINT / HPI:  ? ?No chief complaint on file. ? ? ? ?Katelyn Sharp is a 31 y.o. female here for ***  ? ?Pt reports ***  ? ? ?PERTINENT  PMH / PSH: reviewed and updated as appropriate  ? ?OBJECTIVE:  ? ?LMP 08/15/2021 (Exact Date)   ?*** ? ?ASSESSMENT/PLAN:  ? ?No problem-specific Assessment & Plan notes found for this encounter. ?  ? ? ?Katha Cabal, DO ?PGY-3, [facility] ?02/03/2022  ? ? ? ? ?{    This will disappear when note is signed, click to select method of visit    :1} ? ? ? ?

## 2022-02-18 ENCOUNTER — Ambulatory Visit (INDEPENDENT_AMBULATORY_CARE_PROVIDER_SITE_OTHER): Payer: Medicaid Other | Admitting: Family Medicine

## 2022-02-18 VITALS — BP 129/69 | HR 92 | Wt 178.0 lb

## 2022-02-18 DIAGNOSIS — O361920 Maternal care for other isoimmunization, second trimester, not applicable or unspecified: Secondary | ICD-10-CM

## 2022-02-18 DIAGNOSIS — O0990 Supervision of high risk pregnancy, unspecified, unspecified trimester: Secondary | ICD-10-CM

## 2022-02-18 DIAGNOSIS — Z23 Encounter for immunization: Secondary | ICD-10-CM

## 2022-02-18 DIAGNOSIS — Z789 Other specified health status: Secondary | ICD-10-CM

## 2022-02-18 DIAGNOSIS — O10919 Unspecified pre-existing hypertension complicating pregnancy, unspecified trimester: Secondary | ICD-10-CM

## 2022-02-18 DIAGNOSIS — O24113 Pre-existing diabetes mellitus, type 2, in pregnancy, third trimester: Secondary | ICD-10-CM

## 2022-02-18 DIAGNOSIS — Z758 Other problems related to medical facilities and other health care: Secondary | ICD-10-CM

## 2022-02-18 NOTE — Progress Notes (Signed)
? ?  PRENATAL VISIT NOTE ? ?Subjective:  ?Katelyn Sharp is a 31 y.o. G5P4004 at [redacted]w[redacted]d being seen today for ongoing prenatal care.  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 ? ?Patient reports no complaints.  Contractions: Not present. Vag. Bleeding: None.  Movement: Present. Denies leaking of fluid.  ? ?The following portions of the patient's history were reviewed and updated as appropriate: allergies, current medications, past family history, past medical history, past social history, past surgical history and problem list.  ? ?Objective:  ? ?Vitals:  ? 02/18/22 1533  ?BP: 129/69  ?Pulse: 92  ?Weight: 178 lb (80.7 kg)  ? ? ?Fetal Status: Fetal Heart Rate (bpm): 156 Fundal Height: 31 cm Movement: Present    ? ?General:  Alert, oriented and cooperative. Patient is in no acute distress.  ?Skin: Skin is warm and dry. No rash noted.   ?Cardiovascular: Normal heart rate noted  ?Respiratory: Normal respiratory effort, no problems with respiration noted  ?Abdomen: Soft, gravid, appropriate for gestational age.  Pain/Pressure: Absent     ?Pelvic: Cervical exam deferred        ?Extremities: Normal range of motion.     ?Mental Status: Normal mood and affect. Normal behavior. Normal judgment and thought content.  ? ?Assessment and Plan:  ?Pregnancy: [id number] at [redacted]w[redacted]d ?1. Chronic hypertension during pregnancy, antepartum ? ? ?2. Pre-existing type 2 diabetes mellitus during pregnancy in third trimester ?BP is well controlled on ne meds ?Continue ASA ? ?3. Language barrier ?Swahili interpreter: video used ? ? ?4. Supervision of high risk pregnancy,  antepartum ?- Tdap vaccine greater than or equal to 7yo IM ? ?5. 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 ?Preterm labor symptoms and general obstetric precautions including but not limited to vaginal bleeding, contractions, leaking of fluid and fetal movement were reviewed in detail with the patient. ?Please refer to After Visit Summary for other counseling recommendations.  ? ?Return in 2 weeks (on 03/04/2022). ? ?Future Appointments  ?Date Time Provider [place]  ?02/26/2022 11:15 AM [facility] NURSE [facility]  ?02/26/2022 11:30 AM [facility] US2 [facility]  ?03/01/2022  2:55 PM Donnamae Jude, MD [facility]  ?03/05/2022 11:15 AM [facility] NURSE [facility]  ?03/05/2022 11:30 AM [facility] US2 [facility] [place]  ? ? ?Donnamae Jude, MD ? ? ?

## 2022-02-18 NOTE — Patient Instructions (Signed)

## 2022-02-19 ENCOUNTER — Ambulatory Visit: Payer: Medicaid Other | Admitting: *Deleted

## 2022-02-19 ENCOUNTER — Ambulatory Visit: Payer: Medicaid Other | Attending: Obstetrics and Gynecology

## 2022-02-19 VITALS — BP 120/53 | HR 82

## 2022-02-19 DIAGNOSIS — O24113 Pre-existing diabetes mellitus, type 2, in pregnancy, third trimester: Secondary | ICD-10-CM | POA: Diagnosis not present

## 2022-02-19 DIAGNOSIS — E119 Type 2 diabetes mellitus without complications: Secondary | ICD-10-CM | POA: Diagnosis not present

## 2022-02-19 DIAGNOSIS — O283 Abnormal ultrasonic finding on antenatal screening of mother: Secondary | ICD-10-CM

## 2022-02-19 DIAGNOSIS — O10013 Pre-existing essential hypertension complicating pregnancy, third trimester: Secondary | ICD-10-CM | POA: Diagnosis not present

## 2022-02-19 DIAGNOSIS — O0990 Supervision of high risk pregnancy, unspecified, unspecified trimester: Secondary | ICD-10-CM | POA: Insufficient documentation

## 2022-02-19 DIAGNOSIS — O99213 Obesity complicating pregnancy, third trimester: Secondary | ICD-10-CM | POA: Diagnosis not present

## 2022-02-19 DIAGNOSIS — Z3A32 32 weeks gestation of pregnancy: Secondary | ICD-10-CM | POA: Diagnosis not present

## 2022-02-19 DIAGNOSIS — O361930 Maternal care for other isoimmunization, third trimester, not applicable or unspecified: Secondary | ICD-10-CM | POA: Insufficient documentation

## 2022-02-19 DIAGNOSIS — O358XX0 Maternal care for other (suspected) fetal abnormality and damage, not applicable or unspecified: Secondary | ICD-10-CM

## 2022-02-19 DIAGNOSIS — O10913 Unspecified pre-existing hypertension complicating pregnancy, third trimester: Secondary | ICD-10-CM

## 2022-02-22 LAB — AB SCR+ANTIBODY ID: Antibody Screen: POSITIVE — AB

## 2022-02-22 LAB — ANTIBODY SCREEN

## 2022-02-25 ENCOUNTER — Encounter: Payer: Medicaid Other | Admitting: Family Medicine

## 2022-02-26 ENCOUNTER — Encounter: Payer: Self-pay | Admitting: *Deleted

## 2022-02-26 ENCOUNTER — Ambulatory Visit: Payer: Medicaid Other | Attending: Obstetrics and Gynecology

## 2022-02-26 ENCOUNTER — Other Ambulatory Visit: Payer: Self-pay | Admitting: *Deleted

## 2022-02-26 ENCOUNTER — Ambulatory Visit: Payer: Medicaid Other | Admitting: *Deleted

## 2022-02-26 VITALS — BP 119/61 | HR 83

## 2022-02-26 DIAGNOSIS — O24113 Pre-existing diabetes mellitus, type 2, in pregnancy, third trimester: Secondary | ICD-10-CM

## 2022-02-26 DIAGNOSIS — O99213 Obesity complicating pregnancy, third trimester: Secondary | ICD-10-CM | POA: Diagnosis not present

## 2022-02-26 DIAGNOSIS — Z3A33 33 weeks gestation of pregnancy: Secondary | ICD-10-CM

## 2022-02-26 DIAGNOSIS — O358XX0 Maternal care for other (suspected) fetal abnormality and damage, not applicable or unspecified: Secondary | ICD-10-CM

## 2022-02-26 DIAGNOSIS — O0990 Supervision of high risk pregnancy, unspecified, unspecified trimester: Secondary | ICD-10-CM | POA: Insufficient documentation

## 2022-02-26 DIAGNOSIS — O283 Abnormal ultrasonic finding on antenatal screening of mother: Secondary | ICD-10-CM | POA: Diagnosis not present

## 2022-02-26 DIAGNOSIS — O361930 Maternal care for other isoimmunization, third trimester, not applicable or unspecified: Secondary | ICD-10-CM | POA: Diagnosis not present

## 2022-02-26 DIAGNOSIS — E669 Obesity, unspecified: Secondary | ICD-10-CM | POA: Diagnosis not present

## 2022-02-26 DIAGNOSIS — O10013 Pre-existing essential hypertension complicating pregnancy, third trimester: Secondary | ICD-10-CM | POA: Diagnosis not present

## 2022-02-26 DIAGNOSIS — O10913 Unspecified pre-existing hypertension complicating pregnancy, third trimester: Secondary | ICD-10-CM

## 2022-03-01 ENCOUNTER — Ambulatory Visit (INDEPENDENT_AMBULATORY_CARE_PROVIDER_SITE_OTHER): Payer: Medicaid Other | Admitting: Family Medicine

## 2022-03-01 VITALS — BP 121/73 | HR 84 | Wt 177.3 lb

## 2022-03-01 DIAGNOSIS — Z789 Other specified health status: Secondary | ICD-10-CM

## 2022-03-01 DIAGNOSIS — O24113 Pre-existing diabetes mellitus, type 2, in pregnancy, third trimester: Secondary | ICD-10-CM

## 2022-03-01 DIAGNOSIS — O10919 Unspecified pre-existing hypertension complicating pregnancy, unspecified trimester: Secondary | ICD-10-CM

## 2022-03-01 DIAGNOSIS — O0990 Supervision of high risk pregnancy, unspecified, unspecified trimester: Secondary | ICD-10-CM

## 2022-03-01 NOTE — Patient Instructions (Signed)

## 2022-03-02 NOTE — Progress Notes (Signed)
? ?  PRENATAL VISIT NOTE ? ?Subjective:  ?Katelyn Sharp is a 31 y.o. G5P4004 at [redacted]w[redacted]d being seen today for ongoing prenatal care.  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 ? ?Patient reports no complaints.  Contractions: Not present. Vag. Bleeding: None.  Movement: Present. Denies leaking of fluid.  ? ?The following portions of the patient's history were reviewed and updated as appropriate: allergies, current medications, past family history, past medical history, past social history, past surgical history and problem list.  ? ?Objective:  ? ?Vitals:  ? 03/01/22 1508  ?BP: 121/73  ?Pulse: 84  ?Weight: 177 lb 4.8 oz (80.4 kg)  ? ? ?Fetal Status: Fetal Heart Rate (bpm): 144 Fundal Height: 32 cm Movement: Present    ? ?General:  Alert, oriented and cooperative. Patient is in no acute distress.  ?Skin: Skin is warm and dry. No rash noted.   ?Cardiovascular: Normal heart rate noted  ?Respiratory: Normal respiratory effort, no problems with respiration noted  ?Abdomen: Soft, gravid, appropriate for gestational age.  Pain/Pressure: Absent     ?Pelvic: Cervical exam deferred        ?Extremities: Normal range of motion.  Edema: None  ?Mental Status: Normal mood and affect. Normal behavior. Normal judgment and thought content.  ? ?Assessment and Plan:  ?Pregnancy: [id number] at [redacted]w[redacted]d ?1. Chronic hypertension during pregnancy, antepartum ?On no meds growth is WNL ? ?2. Pre-existing type 2 diabetes mellitus during pregnancy in third trimester ?  ? ?CBGs are in range on Metformin ?Last u/s for growth 73% ? ?3. Language barrier ?Swahili interpreter: Lorre Nick  used ? ? ?4. Supervision of high risk pregnancy, antepartum ? ? ?Preterm labor symptoms and general obstetric precautions including but not limited to vaginal bleeding, contractions, leaking of fluid and fetal movement were reviewed in detail with the patient. ?Please refer to After Visit Summary for other counseling recommendations.  ? ?Return in 2 weeks (on 03/15/2022) for [facility], OB visit and BPP, BPP only in 1 week. ? ?Future Appointments  ?Date Time Provider [place]  ?03/05/2022 11:15 AM [facility] NURSE [facility]  ?03/05/2022 11:30 AM [facility] US2 [facility] [place]  ?03/10/2022  7:15 AM [facility] NURSE [facility]  ?03/10/2022  7:30 AM [facility] US3 [facility]  ?03/16/2022  3:55 PM [name], MD [facility]  ?03/19/2022  1:45 PM [facility] NURSE [facility] [place]  ?03/19/2022  2:00 PM [facility] US1 [facility]  ?03/26/2022  1:45 PM [facility] NURSE [facility]  ?03/26/2022  2:00 PM [facility] US1 [facility]  ? ? ?Donnamae Jude, MD ? ? ?

## 2022-03-05 ENCOUNTER — Ambulatory Visit: Payer: Medicaid Other | Attending: Obstetrics and Gynecology

## 2022-03-05 ENCOUNTER — Ambulatory Visit: Payer: Medicaid Other | Admitting: *Deleted

## 2022-03-05 ENCOUNTER — Encounter: Payer: Self-pay | Admitting: *Deleted

## 2022-03-05 ENCOUNTER — Other Ambulatory Visit: Payer: Self-pay | Admitting: Obstetrics and Gynecology

## 2022-03-05 VITALS — BP 122/61 | HR 79

## 2022-03-05 DIAGNOSIS — O0990 Supervision of high risk pregnancy, unspecified, unspecified trimester: Secondary | ICD-10-CM

## 2022-03-05 DIAGNOSIS — O99213 Obesity complicating pregnancy, third trimester: Secondary | ICD-10-CM | POA: Insufficient documentation

## 2022-03-05 DIAGNOSIS — O24113 Pre-existing diabetes mellitus, type 2, in pregnancy, third trimester: Secondary | ICD-10-CM

## 2022-03-05 DIAGNOSIS — O10913 Unspecified pre-existing hypertension complicating pregnancy, third trimester: Secondary | ICD-10-CM | POA: Diagnosis not present

## 2022-03-05 DIAGNOSIS — O361930 Maternal care for other isoimmunization, third trimester, not applicable or unspecified: Secondary | ICD-10-CM | POA: Insufficient documentation

## 2022-03-05 DIAGNOSIS — Z3A34 34 weeks gestation of pregnancy: Secondary | ICD-10-CM | POA: Diagnosis not present

## 2022-03-05 DIAGNOSIS — O283 Abnormal ultrasonic finding on antenatal screening of mother: Secondary | ICD-10-CM

## 2022-03-05 DIAGNOSIS — O10013 Pre-existing essential hypertension complicating pregnancy, third trimester: Secondary | ICD-10-CM | POA: Diagnosis not present

## 2022-03-05 NOTE — Procedures (Signed)
Katelyn Sharp ?07/20/1991 ?[redacted]w[redacted]d ? ?Fetus A Non-Stress Test Interpretation for 03/05/22 ? ?Indication: Unsatisfactory BPP ? ?Fetal Heart Rate A ?Mode: External ?Baseline Rate (A): 135 bpm ?Variability: Moderate ?Accelerations: 15 x 15 ?Decelerations: None ?Multiple birth?: No ? ?Uterine Activity ?Mode: Palpation, Toco ?Contraction Frequency (min): ui ?Resting Tone Palpated: Relaxed ? ?Interpretation (Fetal Testing) ?Nonstress Test Interpretation: Reactive ?Overall Impression: Reassuring for gestational age ?Comments: Dr. Parke Poisson reviewed tracing ? ? ?

## 2022-03-10 ENCOUNTER — Ambulatory Visit: Payer: Medicaid Other | Admitting: *Deleted

## 2022-03-10 ENCOUNTER — Ambulatory Visit: Payer: Medicaid Other | Attending: Obstetrics and Gynecology

## 2022-03-10 VITALS — BP 106/53 | HR 66

## 2022-03-10 DIAGNOSIS — O10013 Pre-existing essential hypertension complicating pregnancy, third trimester: Secondary | ICD-10-CM

## 2022-03-10 DIAGNOSIS — E119 Type 2 diabetes mellitus without complications: Secondary | ICD-10-CM | POA: Diagnosis not present

## 2022-03-10 DIAGNOSIS — O0990 Supervision of high risk pregnancy, unspecified, unspecified trimester: Secondary | ICD-10-CM | POA: Diagnosis not present

## 2022-03-10 DIAGNOSIS — Z3A34 34 weeks gestation of pregnancy: Secondary | ICD-10-CM

## 2022-03-10 DIAGNOSIS — O24113 Pre-existing diabetes mellitus, type 2, in pregnancy, third trimester: Secondary | ICD-10-CM | POA: Diagnosis not present

## 2022-03-10 DIAGNOSIS — O361930 Maternal care for other isoimmunization, third trimester, not applicable or unspecified: Secondary | ICD-10-CM | POA: Diagnosis not present

## 2022-03-10 DIAGNOSIS — O358XX0 Maternal care for other (suspected) fetal abnormality and damage, not applicable or unspecified: Secondary | ICD-10-CM | POA: Diagnosis not present

## 2022-03-16 ENCOUNTER — Encounter: Payer: Medicaid Other | Admitting: Family Medicine

## 2022-03-16 DIAGNOSIS — O10919 Unspecified pre-existing hypertension complicating pregnancy, unspecified trimester: Secondary | ICD-10-CM

## 2022-03-16 DIAGNOSIS — Z789 Other specified health status: Secondary | ICD-10-CM

## 2022-03-16 DIAGNOSIS — O0990 Supervision of high risk pregnancy, unspecified, unspecified trimester: Secondary | ICD-10-CM

## 2022-03-16 DIAGNOSIS — O361920 Maternal care for other isoimmunization, second trimester, not applicable or unspecified: Secondary | ICD-10-CM

## 2022-03-16 DIAGNOSIS — Z3A35 35 weeks gestation of pregnancy: Secondary | ICD-10-CM

## 2022-03-16 DIAGNOSIS — O24113 Pre-existing diabetes mellitus, type 2, in pregnancy, third trimester: Secondary | ICD-10-CM

## 2022-03-19 ENCOUNTER — Ambulatory Visit: Payer: Medicaid Other | Admitting: *Deleted

## 2022-03-19 ENCOUNTER — Encounter: Payer: Self-pay | Admitting: *Deleted

## 2022-03-19 ENCOUNTER — Ambulatory Visit: Payer: Medicaid Other | Attending: Obstetrics and Gynecology

## 2022-03-19 ENCOUNTER — Other Ambulatory Visit: Payer: Self-pay | Admitting: *Deleted

## 2022-03-19 VITALS — BP 116/62 | HR 65

## 2022-03-19 DIAGNOSIS — O0990 Supervision of high risk pregnancy, unspecified, unspecified trimester: Secondary | ICD-10-CM | POA: Insufficient documentation

## 2022-03-19 DIAGNOSIS — O10013 Pre-existing essential hypertension complicating pregnancy, third trimester: Secondary | ICD-10-CM

## 2022-03-19 DIAGNOSIS — O361930 Maternal care for other isoimmunization, third trimester, not applicable or unspecified: Secondary | ICD-10-CM

## 2022-03-19 DIAGNOSIS — O24113 Pre-existing diabetes mellitus, type 2, in pregnancy, third trimester: Secondary | ICD-10-CM | POA: Insufficient documentation

## 2022-03-19 DIAGNOSIS — O358XX0 Maternal care for other (suspected) fetal abnormality and damage, not applicable or unspecified: Secondary | ICD-10-CM

## 2022-03-19 DIAGNOSIS — Z3A36 36 weeks gestation of pregnancy: Secondary | ICD-10-CM

## 2022-03-22 ENCOUNTER — Telehealth: Payer: Self-pay

## 2022-03-22 NOTE — Telephone Encounter (Signed)
Patient contacted with appointment reminders.  Nicole Cella Treasure Ochs RN BSN PCCN  [facility] [phone]-cell [phone]-office

## 2022-03-26 ENCOUNTER — Ambulatory Visit: Payer: Medicaid Other | Admitting: *Deleted

## 2022-03-26 ENCOUNTER — Ambulatory Visit: Payer: Medicaid Other | Attending: Obstetrics and Gynecology

## 2022-03-26 VITALS — BP 120/64 | HR 88

## 2022-03-26 DIAGNOSIS — O0990 Supervision of high risk pregnancy, unspecified, unspecified trimester: Secondary | ICD-10-CM

## 2022-03-26 DIAGNOSIS — O358XX0 Maternal care for other (suspected) fetal abnormality and damage, not applicable or unspecified: Secondary | ICD-10-CM | POA: Diagnosis not present

## 2022-03-26 DIAGNOSIS — O10013 Pre-existing essential hypertension complicating pregnancy, third trimester: Secondary | ICD-10-CM | POA: Diagnosis not present

## 2022-03-26 DIAGNOSIS — O361930 Maternal care for other isoimmunization, third trimester, not applicable or unspecified: Secondary | ICD-10-CM

## 2022-03-26 DIAGNOSIS — O24113 Pre-existing diabetes mellitus, type 2, in pregnancy, third trimester: Secondary | ICD-10-CM | POA: Diagnosis not present

## 2022-03-26 DIAGNOSIS — Z3A37 37 weeks gestation of pregnancy: Secondary | ICD-10-CM

## 2022-04-02 ENCOUNTER — Ambulatory Visit: Payer: Medicaid Other | Attending: Obstetrics

## 2022-04-02 ENCOUNTER — Ambulatory Visit: Payer: Medicaid Other | Admitting: *Deleted

## 2022-04-02 VITALS — BP 126/69 | HR 91

## 2022-04-02 DIAGNOSIS — O0990 Supervision of high risk pregnancy, unspecified, unspecified trimester: Secondary | ICD-10-CM | POA: Diagnosis not present

## 2022-04-02 DIAGNOSIS — O361930 Maternal care for other isoimmunization, third trimester, not applicable or unspecified: Secondary | ICD-10-CM | POA: Diagnosis not present

## 2022-04-02 DIAGNOSIS — O10019 Pre-existing essential hypertension complicating pregnancy, unspecified trimester: Secondary | ICD-10-CM

## 2022-04-02 DIAGNOSIS — O358XX0 Maternal care for other (suspected) fetal abnormality and damage, not applicable or unspecified: Secondary | ICD-10-CM | POA: Diagnosis not present

## 2022-04-02 DIAGNOSIS — Z3A38 38 weeks gestation of pregnancy: Secondary | ICD-10-CM

## 2022-04-02 DIAGNOSIS — O24113 Pre-existing diabetes mellitus, type 2, in pregnancy, third trimester: Secondary | ICD-10-CM

## 2022-04-05 ENCOUNTER — Ambulatory Visit (INDEPENDENT_AMBULATORY_CARE_PROVIDER_SITE_OTHER): Payer: Medicaid Other | Admitting: Obstetrics and Gynecology

## 2022-04-05 ENCOUNTER — Telehealth: Payer: Self-pay

## 2022-04-05 ENCOUNTER — Encounter: Payer: Self-pay | Admitting: Obstetrics and Gynecology

## 2022-04-05 ENCOUNTER — Other Ambulatory Visit (HOSPITAL_COMMUNITY)
Admission: RE | Admit: 2022-04-05 | Discharge: 2022-04-05 | Disposition: A | Discharge status: Home / Self Care | Payer: Medicaid Other | Source: Ambulatory Visit | Attending: Obstetrics and Gynecology | Admitting: Obstetrics and Gynecology

## 2022-04-05 VITALS — BP 125/60 | HR 82 | Wt 184.0 lb

## 2022-04-05 DIAGNOSIS — O0990 Supervision of high risk pregnancy, unspecified, unspecified trimester: Secondary | ICD-10-CM | POA: Diagnosis not present

## 2022-04-05 DIAGNOSIS — O24113 Pre-existing diabetes mellitus, type 2, in pregnancy, third trimester: Secondary | ICD-10-CM

## 2022-04-05 DIAGNOSIS — O10919 Unspecified pre-existing hypertension complicating pregnancy, unspecified trimester: Secondary | ICD-10-CM

## 2022-04-05 DIAGNOSIS — O361920 Maternal care for other isoimmunization, second trimester, not applicable or unspecified: Secondary | ICD-10-CM

## 2022-04-05 NOTE — Telephone Encounter (Signed)
Patient missed previous appointment.Scheduled to be seen today at 3:15pm  [place] Nurse [phone]

## 2022-04-05 NOTE — Patient Instructions (Signed)

## 2022-04-05 NOTE — Progress Notes (Signed)
Subjective:  Katelyn Sharp is a 31 y.o. G5P4004 at [redacted]w[redacted]d being seen today for ongoing prenatal care.  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  Patient reports general discomforts of pregnancy.  Contractions: Not present. Vag. Bleeding: None.  Movement: Present. Denies leaking of fluid.   The following portions of the patient's history were reviewed and updated as appropriate: allergies, current medications, past family history, past medical history, past social history, past surgical history and problem list. Problem list updated.  Objective:   Vitals:   04/05/22 1517  BP: 125/60  Pulse: 82  Weight: 184 lb (83.5 kg)    Fetal Status: Fetal Heart Rate (bpm): 148   Movement: Present     General:  Alert, oriented and cooperative. Patient is in no acute distress.  Skin: Skin is warm and dry. No rash noted.   Cardiovascular: Normal heart rate noted  Respiratory: Normal respiratory effort, no problems with respiration noted  Abdomen: Soft, gravid, appropriate for gestational age. Pain/Pressure: Absent     Pelvic:  Cervical exam performed        Extremities: Normal range of motion.  Edema: None  Mental Status: Normal mood and affect. Normal behavior. Normal judgment and thought content.   Urinalysis:      Assessment and Plan:  Pregnancy: G5P4004 at [redacted]w[redacted]d  1. Supervision of high risk pregnancy, antepartum Stable GBS and vaginal cultures obtained  2. Chronic hypertension during pregnancy, antepartum BP stable without meds Continue with weekly antenatal testing Growth 83 % on 03/19/22  3. Pre-existing type 2  diabetes mellitus during pregnancy in third trimester Stable Pt forgot to bring CBG readings but reports in goal range Antenatal testing and growth U/S, as above Declines IOL  4. 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 Please refer to After Visit Summary for other counseling recommendations.  Return in about 1 week (around 04/12/2022) for OB visit, face to face, MD only.   [name], MD

## 2022-04-05 NOTE — Progress Notes (Signed)
Pt presents for ROB without complaints today. Pt reports CBG WNL. She forgot to bring log.  She declines IOL.

## 2022-04-06 LAB — CERVICOVAGINAL ANCILLARY ONLY
Chlamydia: NEGATIVE
Comment: NEGATIVE
Comment: NORMAL
Neisseria Gonorrhea: NEGATIVE

## 2022-04-07 LAB — STREP GP B NAA: Strep Gp B NAA: NEGATIVE

## 2022-04-09 ENCOUNTER — Encounter: Payer: Self-pay | Admitting: *Deleted

## 2022-04-09 ENCOUNTER — Ambulatory Visit: Payer: Medicaid Other | Admitting: *Deleted

## 2022-04-09 ENCOUNTER — Ambulatory Visit (HOSPITAL_BASED_OUTPATIENT_CLINIC_OR_DEPARTMENT_OTHER): Payer: Medicaid Other | Admitting: *Deleted

## 2022-04-09 ENCOUNTER — Other Ambulatory Visit: Payer: Self-pay | Admitting: Family Medicine

## 2022-04-09 ENCOUNTER — Inpatient Hospital Stay (HOSPITAL_COMMUNITY)
Admission: AD | Admit: 2022-04-09 | Discharge: 2022-04-12 | DRG: 805 | Disposition: A | Discharge status: Home / Self Care | Payer: Medicaid Other | Attending: Obstetrics & Gynecology | Admitting: Obstetrics & Gynecology

## 2022-04-09 ENCOUNTER — Telehealth: Payer: Self-pay

## 2022-04-09 ENCOUNTER — Ambulatory Visit: Payer: Medicaid Other | Attending: Obstetrics | Admitting: Obstetrics

## 2022-04-09 ENCOUNTER — Encounter (HOSPITAL_COMMUNITY): Payer: Self-pay | Admitting: Family Medicine

## 2022-04-09 VITALS — BP 112/56 | HR 89

## 2022-04-09 DIAGNOSIS — O10919 Unspecified pre-existing hypertension complicating pregnancy, unspecified trimester: Secondary | ICD-10-CM

## 2022-04-09 DIAGNOSIS — E119 Type 2 diabetes mellitus without complications: Secondary | ICD-10-CM | POA: Diagnosis present

## 2022-04-09 DIAGNOSIS — O361910 Maternal care for other isoimmunization, first trimester, not applicable or unspecified: Secondary | ICD-10-CM | POA: Diagnosis present

## 2022-04-09 DIAGNOSIS — Z7982 Long term (current) use of aspirin: Secondary | ICD-10-CM

## 2022-04-09 DIAGNOSIS — Z789 Other specified health status: Secondary | ICD-10-CM | POA: Diagnosis present

## 2022-04-09 DIAGNOSIS — O24113 Pre-existing diabetes mellitus, type 2, in pregnancy, third trimester: Secondary | ICD-10-CM

## 2022-04-09 DIAGNOSIS — Z3A39 39 weeks gestation of pregnancy: Secondary | ICD-10-CM | POA: Diagnosis not present

## 2022-04-09 DIAGNOSIS — O10913 Unspecified pre-existing hypertension complicating pregnancy, third trimester: Secondary | ICD-10-CM | POA: Insufficient documentation

## 2022-04-09 DIAGNOSIS — O1002 Pre-existing essential hypertension complicating childbirth: Principal | ICD-10-CM | POA: Diagnosis present

## 2022-04-09 DIAGNOSIS — Z7984 Long term (current) use of oral hypoglycemic drugs: Secondary | ICD-10-CM | POA: Diagnosis not present

## 2022-04-09 DIAGNOSIS — O0990 Supervision of high risk pregnancy, unspecified, unspecified trimester: Secondary | ICD-10-CM

## 2022-04-09 DIAGNOSIS — O24319 Unspecified pre-existing diabetes mellitus in pregnancy, unspecified trimester: Secondary | ICD-10-CM

## 2022-04-09 DIAGNOSIS — O361920 Maternal care for other isoimmunization, second trimester, not applicable or unspecified: Secondary | ICD-10-CM | POA: Diagnosis present

## 2022-04-09 DIAGNOSIS — O24913 Unspecified diabetes mellitus in pregnancy, third trimester: Secondary | ICD-10-CM | POA: Insufficient documentation

## 2022-04-09 DIAGNOSIS — O2412 Pre-existing diabetes mellitus, type 2, in childbirth: Secondary | ICD-10-CM | POA: Diagnosis present

## 2022-04-09 DIAGNOSIS — Z603 Acculturation difficulty: Secondary | ICD-10-CM | POA: Diagnosis present

## 2022-04-09 DIAGNOSIS — O24119 Pre-existing diabetes mellitus, type 2, in pregnancy, unspecified trimester: Secondary | ICD-10-CM | POA: Diagnosis present

## 2022-04-09 LAB — CBC
HCT: 32.9 % — ABNORMAL LOW (ref 36.0–46.0)
Hemoglobin: 10.4 g/dL — ABNORMAL LOW (ref 12.0–15.0)
MCH: 28.9 pg (ref 26.0–34.0)
MCHC: 31.6 g/dL (ref 30.0–36.0)
MCV: 91.4 fL (ref 80.0–100.0)
Platelets: 273 10*3/uL (ref 150–400)
RBC: 3.6 MIL/uL — ABNORMAL LOW (ref 3.87–5.11)
RDW: 13.7 % (ref 11.5–15.5)
WBC: 6.2 10*3/uL (ref 4.0–10.5)
nRBC: 0 % (ref 0.0–0.2)

## 2022-04-09 LAB — RAPID HIV SCREEN (HIV 1/2 AB+AG)
HIV 1/2 Antibodies: NONREACTIVE
HIV-1 P24 Antigen - HIV24: NONREACTIVE

## 2022-04-09 LAB — GLUCOSE, CAPILLARY
Glucose-Capillary: 74 mg/dL (ref 70–99)
Glucose-Capillary: 91 mg/dL (ref 70–99)

## 2022-04-09 MED ORDER — TERBUTALINE SULFATE 1 MG/ML IJ SOLN: 0.2500 mg | Freq: Once | INTRAMUSCULAR | Status: DC | PRN

## 2022-04-09 MED ORDER — MISOPROSTOL 25 MCG QUARTER TABLET: 25.0000 ug | ORAL_TABLET | ORAL | Status: DC | PRN

## 2022-04-09 MED ORDER — OXYTOCIN-SODIUM CHLORIDE 30-0.9 UT/500ML-% IV SOLN
2.5000 [IU]/h | INTRAVENOUS | Status: DC
  Filled 2022-04-09: qty 500

## 2022-04-09 MED ORDER — LIDOCAINE HCL (PF) 1 % IJ SOLN: 30.0000 mL | INTRAMUSCULAR | Status: DC | PRN

## 2022-04-09 MED ORDER — MISOPROSTOL 50MCG HALF TABLET
50.0000 ug | ORAL_TABLET | ORAL | Status: DC
  Administered 2022-04-09: 50 ug via BUCCAL

## 2022-04-09 MED ORDER — FENTANYL CITRATE (PF) 100 MCG/2ML IJ SOLN
100.0000 ug | INTRAMUSCULAR | Status: DC | PRN
  Administered 2022-04-09 – 2022-04-10 (×2): 100 ug via INTRAVENOUS
  Filled 2022-04-09 (×2): qty 2

## 2022-04-09 MED ORDER — OXYCODONE-ACETAMINOPHEN 5-325 MG PO TABS: 1.0000 | ORAL_TABLET | ORAL | Status: DC | PRN

## 2022-04-09 MED ORDER — SOD CITRATE-CITRIC ACID 500-334 MG/5ML PO SOLN
30.0000 mL | ORAL | Status: DC | PRN
Start: 2022-04-09 — End: 2022-04-10

## 2022-04-09 MED ORDER — ONDANSETRON HCL 4 MG/2ML IJ SOLN: 4.0000 mg | Freq: Four times a day (QID) | INTRAMUSCULAR | Status: DC | PRN

## 2022-04-09 MED ORDER — LACTATED RINGERS IV SOLN: INTRAVENOUS | Status: DC

## 2022-04-09 MED ORDER — OXYTOCIN BOLUS FROM INFUSION
333.0000 mL | Freq: Once | INTRAVENOUS | Status: AC
  Administered 2022-04-10: 333 mL via INTRAVENOUS

## 2022-04-09 MED ORDER — OXYCODONE-ACETAMINOPHEN 5-325 MG PO TABS: 2.0000 | ORAL_TABLET | ORAL | Status: DC | PRN

## 2022-04-09 MED ORDER — ACETAMINOPHEN 325 MG PO TABS: 650.0000 mg | ORAL_TABLET | ORAL | Status: DC | PRN

## 2022-04-09 MED ORDER — LACTATED RINGERS IV SOLN: 500.0000 mL | INTRAVENOUS | Status: DC | PRN

## 2022-04-09 NOTE — Progress Notes (Signed)
Patient sent to [facility] for IOL

## 2022-04-09 NOTE — Telephone Encounter (Signed)
Patient is currently admitted.Called patient and she has no concerns at this time. I will continue to offer support as needed.  Nicole Cella Wendi Lastra RN BSN PCCN  [facility] [phone]-cell [phone]-office

## 2022-04-09 NOTE — Plan of Care (Signed)
  Problem: Education: Goal: Knowledge of General Education information will improve Description: Including pain rating scale, medication(s)/side effects and non-pharmacologic comfort measures 04/09/2022 1742 by Donnel Saxon, RN Outcome: Progressing 04/09/2022 1742 by Donnel Saxon, RN Outcome: Progressing   Problem: Health Behavior/Discharge Planning: Goal: Ability to manage health-related needs will improve 04/09/2022 1742 by Donnel Saxon, RN Outcome: Progressing 04/09/2022 1742 by Donnel Saxon, RN Outcome: Progressing   Problem: Clinical Measurements: Goal: Ability to maintain clinical measurements within normal limits will improve 04/09/2022 1742 by Donnel Saxon, RN Outcome: Progressing 04/09/2022 1742 by Donnel Saxon, RN Outcome: Progressing Goal: Will remain free from infection 04/09/2022 1742 by Donnel Saxon, RN Outcome: Progressing 04/09/2022 1742 by Donnel Saxon, RN Outcome: Progressing Goal: Diagnostic test results will improve 04/09/2022 1742 by Donnel Saxon, RN Outcome: Progressing 04/09/2022 1742 by Donnel Saxon, RN Outcome: Progressing Goal: Respiratory complications will improve 04/09/2022 1742 by Donnel Saxon, RN Outcome: Progressing 04/09/2022 1742 by Donnel Saxon, RN Outcome: Progressing Goal: Cardiovascular complication will be avoided 04/09/2022 1742 by Donnel Saxon, RN Outcome: Progressing 04/09/2022 1742 by Donnel Saxon, RN Outcome: Progressing   Problem: Activity: Goal: Risk for activity intolerance will decrease 04/09/2022 1742 by Donnel Saxon, RN Outcome: Progressing 04/09/2022 1742 by Donnel Saxon, RN Outcome: Progressing   Problem: Nutrition: Goal: Adequate nutrition will be maintained 04/09/2022 1742 by Donnel Saxon, RN Outcome: Progressing 04/09/2022 1742 by Donnel Saxon, RN Outcome: Progressing   Problem:  Coping: Goal: Level of anxiety will decrease 04/09/2022 1742 by Donnel Saxon, RN Outcome: Progressing 04/09/2022 1742 by Donnel Saxon, RN Outcome: Progressing   Problem: Elimination: Goal: Will not experience complications related to bowel motility 04/09/2022 1742 by Donnel Saxon, RN Outcome: Progressing 04/09/2022 1742 by Donnel Saxon, RN Outcome: Progressing Goal: Will not experience complications related to urinary retention 04/09/2022 1742 by Donnel Saxon, RN Outcome: Progressing 04/09/2022 1742 by Donnel Saxon, RN Outcome: Progressing   Problem: Pain Managment: Goal: General experience of comfort will improve 04/09/2022 1742 by Donnel Saxon, RN Outcome: Progressing 04/09/2022 1742 by Donnel Saxon, RN Outcome: Progressing   Problem: Safety: Goal: Ability to remain free from injury will improve 04/09/2022 1742 by Donnel Saxon, RN Outcome: Progressing 04/09/2022 1742 by Donnel Saxon, RN Outcome: Progressing   Problem: Skin Integrity: Goal: Risk for impaired skin integrity will decrease 04/09/2022 1742 by Donnel Saxon, RN Outcome: Progressing 04/09/2022 1742 by Donnel Saxon, RN Outcome: Progressing   Problem: Education: Goal: Knowledge of Childbirth will improve Outcome: Progressing Goal: Ability to make informed decisions regarding treatment and plan of care will improve Outcome: Progressing Goal: Ability to state and carry out methods to decrease the pain will improve Outcome: Progressing Goal: Individualized Educational Video(s) Outcome: Progressing   Problem: Coping: Goal: Ability to verbalize concerns and feelings about labor and delivery will improve Outcome: Progressing   Problem: Life Cycle: Goal: Ability to make normal progression through stages of labor will improve Outcome: Progressing Goal: Ability to effectively push during vaginal delivery will improve Outcome:  Progressing   Problem: Role Relationship: Goal: Will demonstrate positive interactions with the child Outcome: Progressing   Problem: Safety: Goal: Risk of complications during labor and delivery will decrease Outcome: Progressing   Problem: Pain Management: Goal: Relief or control of pain from uterine contractions will improve Outcome: Progressing

## 2022-04-09 NOTE — Progress Notes (Signed)
MFM Note  Katelyn Sharp was seen for an NST today due to chronic hypertension and pregestational diabetes treated with metformin.    She is currently at 39 weeks and 0 days.    She has had limited prenatal care in her current pregnancy and therefore has not had an induction of labor scheduled.  The patient had a reactive NST today.  The most recent EFW performed 2 weeks ago was 6 pounds 15 ounces (83rd percentile).  Due to chronic hypertension and gestational diabetes, delivery is recommended at her current gestational age.  The patient was sent to labor and delivery for induction of labor following today's exam.    The patient stated that all of her questions were answered.    All conversations were held with the patient with a Swahili interpreter.    A total of 20 minutes was spent counseling and coordinating the care for the patient. Greater than 50% of the time was spent in direct face-to-face contact.

## 2022-04-09 NOTE — H&P (Signed)
OBSTETRIC ADMISSION HISTORY AND PHYSICAL  Katelyn Sharp is a 31 y.o. female [phone] with IUP at 11w0dby 2nd trimester U/S presenting for IOL for cHTN and DM2. She reports +FMs, No LOF, no VB, no blurry vision, headaches or peripheral edema, and RUQ pain.  She plans on breast and formula feeding. She is unsure of her desired birth control. She received her prenatal care at [facility]  Dating: By 19wk U/S --->  Estimated Date of Delivery: 04/16/22  Sono:  '@[redacted]w[redacted]d' , CWD, normal anatomy, cephalic presentation, [id number] 83% EFW  Prenatal History/Complications: cHTN and DM2 (diagnosed in early pregnancy)  Past Medical History: Past Medical History:  Diagnosis Date   Essential hypertension    Type 2 diabetes mellitus [place]    Past Surgical History: Past Surgical History:  Procedure Laterality Date   NO PAST SURGERIES     Obstetrical History: OB History     Gravida  5   Para  4   Term  4   Preterm  0   AB  0   Living  4      SAB  0   IAB  0   Ectopic  0   Multiple  0   Live Births  4        Obstetric Comments  G1- 2014- SVD of boy, 2.5 kg- had episiotomy in G1   G2- 2016- Katelyn Sharp, SVD, girl, 2.5 kg  G3- Katelyn Sharp- 2018- SVD, 2.5 kg         Social History Social History   Socioeconomic History   Marital status: Married    Spouse name: Not on file   Number of children: Not on file   Years of education: Not on file   Highest education level: Not on file  Occupational History   Not on file  Tobacco Use   Smoking status: Never   Smokeless tobacco: Never  Vaping Use   Vaping Use: Never used  Substance and Sexual Activity   Alcohol use: Never   Drug use: Never   Sexual activity: Yes    Partners: Male    Birth control/protection: None  Other Topics Concern   Not on file  Social History Narrative   Refugee Information   Number of Immediate Family Members: 4   Number of Immediate Family Members in [place] 4   Date of Arrival: 06/28/19   [place] [facility]  [place] in [place] 20 years or greater   Reason for LBryan PEstate manager/land agentopinion   Primary Language: Swahili/Kiswahili   Able to Read in Primary Language: Yes   Able to Write in Primary Language: Yes   Education: [employer]  Prior Work: Worked at home   Marital Status: Married   Sexual Activity: Yes   Tuberculosis Screening Health Department: Not Completed   Health Department Labs Completed: No(obtaining today)   History of Trauma: None   Do You Feel Jumpy or Nervous?: No   Are You Very Watchful or 'Super Alert'?: No   Social Determinants of Health   Financial Resource Strain: Not on file  Food Insecurity: Not on file  Transportation Needs: Not on file  Physical Activity: Not on file  Stress: Not on file  Social Connections: Not on file   Family History: Family History  Problem Relation Age of Onset   Healthy Mother    Healthy Father    Bleeding Disorder Neg Hx  Hypertension Neg Hx    Diabetes Neg Hx    Allergies: No Known Allergies  Medications Prior to Admission  Medication Sig Dispense Refill Last Dose   Accu-Chek Softclix Lancets lancets To check blood sugars 4 times a day as instructed 100 each 12    aspirin EC 81 MG tablet Take 1 tablet (81 mg total) by mouth daily. 60 tablet 1    Blood Pressure Monitoring (BLOOD PRESSURE KIT) DEVI 1 each by Does not apply route once a week. 1 each 0    glucose blood (ACCU-CHEK GUIDE) test strip To check blood sugars 4 times a day. Fasting and 2 hours after the first bite of breakfast, lunch and dinner 100 each 12    metFORMIN (GLUCOPHAGE) 500 MG tablet Take 1 tablet (500 mg total) by mouth 2 (two) times daily with a meal. 60 tablet 5    Prenatal Vit-Fe Fumarate-FA (PRENATAL VITAMIN) 27-0.8 MG TABS Take 1 Dose by mouth daily. 90 tablet 3    Review of Systems  All systems reviewed and negative except as stated in HPI  Blood pressure 127/87, pulse 97,  temperature 98.1 F (36.7 C), temperature source Oral, resp. rate 18, height '5\' 7"'  (1.702 m), weight 179 lb 14.4 oz (81.6 kg), last menstrual period 08/15/2021, unknown if currently breastfeeding. General appearance: alert, cooperative, appears stated age, and no distress Lungs: clear to auscultation bilaterally Heart: regular rate and rhythm Abdomen: soft, non-tender; bowel sounds normal Pelvic: normal external female genitalia, physiologic discharge Extremities: Homans sign is negative, no sign of DVT DTR's normal Presentation: cephalic Fetal monitoringBaseline: 140 bpm, Variability: Good {> 6 bpm), Accelerations: Reactive, and Decelerations: Absent Uterine activityFrequency: Every 1-6 minutes, Duration: 50-120 seconds, and Intensity: mild Dilation: 5 Effacement (%): 60 Station: -3 Exam by:: Candie Chroman CNM   Prenatal labs: ABO, Rh: --/--/O POS (06/16 1626) Antibody: POS (06/16 1626) Rubella: 30.90 (12/09 1109) RPR: Non Reactive (12/09 1109)  HBsAg: Negative (12/09 1109)  HIV: NON REACTIVE (06/16 1700)  GBS: Negative/-- (06/12 1619)  Early A1C: 6.0, 6.6 48yr ago Genetic screening:  normal Anatomy [place] normal (EIF seen but resolved)  Prenatal Transfer Tool  Maternal Diabetes: Yes:  Diabetes Type:  Pre-pregnancy Genetic Screening: Normal Maternal Ultrasounds/Referrals: Isolated EIF (echogenic intracardiac focus) Fetal Ultrasounds or other Referrals:  Referred to Materal Fetal Medicine  Maternal Substance Abuse:  No Significant Maternal Medications:  None Significant Maternal Lab Results: Group B Strep negative  Results for orders placed or performed during the hospital encounter of 04/09/22 (from the past 24 hour(s))  Type and screen   Collection Time: 04/09/22  4:26 PM  Result Value Ref Range   ABO/RH(D) O POS    Antibody Screen POS    Sample Expiration      04/12/2022,2359 Performed at [facility] [address] [place] [address]  CBC   Collection  Time: 04/09/22  5:00 PM  Result Value Ref Range   WBC 6.2 4.0 - 10.5 K/uL   RBC 3.60 (L) 3.87 - 5.11 MIL/uL   Hemoglobin 10.4 (L) 12.0 - 15.0 g/dL   HCT 32.9 (L) 36.0 - 46.0 %   MCV 91.4 80.0 - 100.0 fL   MCH 28.9 26.0 - 34.0 pg   MCHC 31.6 30.0 - 36.0 g/dL   RDW 13.7 11.5 - 15.5 %   Platelets 273 150 - 400 K/uL   nRBC 0.0 0.0 - 0.2 %  Rapid HIV screen (HIV 1/2 Ab+Ag)   Collection Time: 04/09/22  5:00 PM  Result Value Ref Range   HIV-1 P24 Antigen - HIV24 NON REACTIVE NON REACTIVE   HIV 1/2 Antibodies NON REACTIVE NON REACTIVE   Interpretation (HIV Ag Ab)      A non reactive test result means that HIV 1 or HIV 2 antibodies and HIV 1 p24 antigen were not detected in the specimen.  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 G5P4004 at 25w0dhere for IOL for cHTN and DM2  #Labor: latent, will begin IOL with 526m buccal cytotec and ambulation #Pain: planning unmedicated delivery as she did with her 4 previous births #FWB: Cat 1 #ID:  GBS negative #MOF: breast and formula #MOC: unsure #Circ:  Yes #DM2: q4hr CBG #cHTN: BP stable, no s/sx PEC  JaGabriel CarinaCNM  04/09/2022, 8:07 PM

## 2022-04-09 NOTE — Procedures (Signed)
Katelyn Sharp November 24, 1990 [redacted]w[redacted]d  Fetus A Non-Stress Test Interpretation for 04/09/22  Indication: Chronic Hypertenstion, DM  Fetal Heart Rate A Mode: External Baseline Rate (A): 145 bpm Variability: Minimal, Moderate Accelerations: 15 x 15 Decelerations: None Multiple birth?: No  Uterine Activity Mode: Palpation, Toco Contraction Frequency (min): occ Contraction Duration (sec): 60-120 Contraction Quality: Mild Resting Tone Palpated: Relaxed Resting Time: Adequate  Interpretation (Fetal Testing) Nonstress Test Interpretation: Reactive Overall Impression: Reassuring for gestational age Comments: Dr. Parke Poisson reviewed tracing

## 2022-04-10 ENCOUNTER — Encounter (HOSPITAL_COMMUNITY): Payer: Self-pay | Admitting: Family Medicine

## 2022-04-10 DIAGNOSIS — O1002 Pre-existing essential hypertension complicating childbirth: Secondary | ICD-10-CM

## 2022-04-10 DIAGNOSIS — O2412 Pre-existing diabetes mellitus, type 2, in childbirth: Secondary | ICD-10-CM

## 2022-04-10 DIAGNOSIS — Z3A39 39 weeks gestation of pregnancy: Secondary | ICD-10-CM

## 2022-04-10 LAB — GLUCOSE, CAPILLARY
Glucose-Capillary: 109 mg/dL — ABNORMAL HIGH (ref 70–99)
Glucose-Capillary: 157 mg/dL — ABNORMAL HIGH (ref 70–99)
Glucose-Capillary: 176 mg/dL — ABNORMAL HIGH (ref 70–99)
Glucose-Capillary: 199 mg/dL — ABNORMAL HIGH (ref 70–99)

## 2022-04-10 LAB — RPR: RPR Ser Ql: NONREACTIVE

## 2022-04-10 MED ORDER — OXYCODONE HCL 5 MG PO TABS: 5.0000 mg | ORAL_TABLET | ORAL | Status: DC | PRN

## 2022-04-10 MED ORDER — SENNOSIDES-DOCUSATE SODIUM 8.6-50 MG PO TABS
2.0000 | ORAL_TABLET | Freq: Every day | ORAL | Status: DC
  Administered 2022-04-11: 2 via ORAL
  Filled 2022-04-10: qty 2

## 2022-04-10 MED ORDER — WITCH HAZEL-GLYCERIN EX PADS: 1.0000 | MEDICATED_PAD | CUTANEOUS | Status: DC | PRN

## 2022-04-10 MED ORDER — PRENATAL MULTIVITAMIN CH
1.0000 | ORAL_TABLET | Freq: Every day | ORAL | Status: DC
  Administered 2022-04-10 – 2022-04-11 (×2): 1 via ORAL
  Filled 2022-04-10 (×2): qty 1

## 2022-04-10 MED ORDER — ONDANSETRON HCL 4 MG/2ML IJ SOLN: 4.0000 mg | INTRAMUSCULAR | Status: DC | PRN

## 2022-04-10 MED ORDER — TRANEXAMIC ACID-NACL 1000-0.7 MG/100ML-% IV SOLN
INTRAVENOUS | Status: AC
  Filled 2022-04-10: qty 100

## 2022-04-10 MED ORDER — DIPHENHYDRAMINE HCL 25 MG PO CAPS: 25.0000 mg | ORAL_CAPSULE | Freq: Four times a day (QID) | ORAL | Status: DC | PRN

## 2022-04-10 MED ORDER — SIMETHICONE 80 MG PO CHEW: 80.0000 mg | CHEWABLE_TABLET | ORAL | Status: DC | PRN

## 2022-04-10 MED ORDER — DIBUCAINE (PERIANAL) 1 % EX OINT: 1.0000 | TOPICAL_OINTMENT | CUTANEOUS | Status: DC | PRN

## 2022-04-10 MED ORDER — ACETAMINOPHEN 325 MG PO TABS
650.0000 mg | ORAL_TABLET | ORAL | Status: DC | PRN
  Administered 2022-04-10: 650 mg via ORAL
  Filled 2022-04-10: qty 2

## 2022-04-10 MED ORDER — IBUPROFEN 600 MG PO TABS
600.0000 mg | ORAL_TABLET | Freq: Four times a day (QID) | ORAL | Status: DC
  Administered 2022-04-10 – 2022-04-12 (×8): 600 mg via ORAL
  Filled 2022-04-10 (×8): qty 1

## 2022-04-10 MED ORDER — TRANEXAMIC ACID-NACL 1000-0.7 MG/100ML-% IV SOLN
1000.0000 mg | Freq: Once | INTRAVENOUS | Status: AC
Start: 2022-04-10 — End: 2022-04-10
  Administered 2022-04-10: 1000 mg via INTRAVENOUS

## 2022-04-10 MED ORDER — OXYCODONE HCL 5 MG PO TABS: 10.0000 mg | ORAL_TABLET | ORAL | Status: DC | PRN

## 2022-04-10 MED ORDER — BENZOCAINE-MENTHOL 20-0.5 % EX AERO
1.0000 | INHALATION_SPRAY | CUTANEOUS | Status: DC | PRN
  Administered 2022-04-10: 1 via TOPICAL
  Filled 2022-04-10: qty 56

## 2022-04-10 MED ORDER — COCONUT OIL OIL: 1.0000 | TOPICAL_OIL | Status: DC | PRN

## 2022-04-10 MED ORDER — INSULIN ASPART 100 UNIT/ML IJ SOLN
0.0000 [IU] | INTRAMUSCULAR | Status: DC
  Administered 2022-04-10: 3 [IU] via SUBCUTANEOUS

## 2022-04-10 MED ORDER — ONDANSETRON HCL 4 MG PO TABS: 4.0000 mg | ORAL_TABLET | ORAL | Status: DC | PRN

## 2022-04-10 MED ORDER — FUROSEMIDE 20 MG PO TABS
20.0000 mg | ORAL_TABLET | Freq: Every day | ORAL | Status: DC
  Administered 2022-04-11: 20 mg via ORAL
  Filled 2022-04-10: qty 1

## 2022-04-10 NOTE — Lactation Note (Signed)
This note was copied from a baby's chart. Lactation Consultation Note Attempted to see mom. RN stated that mom wants to do Breast and formula but wants formula right now. RN needs to get mom up to BR d/t uterus shifted. Lactation will see mom on MBU.  [name] [id number] Date: 04/10/2022   Age:31 hours  Maternal Data    Feeding    LATCH Score                    Lactation Tools Discussed/Used    Interventions    Discharge    Consult Status      Charyl Dancer 04/10/2022, 6:26 AM

## 2022-04-10 NOTE — Progress Notes (Signed)
Labor Progress Note Katelyn Sharp is a 31 y.o. [id number] at [redacted]w[redacted]d who presented for IOL due to [facility] and T2DM.   S: Doing well. Recent SROM. No concerns.   O:  BP 130/64   Pulse 85   Temp 98.2 F (36.8 C) (Oral)   Resp 16   Ht 5\' 7"  (1.702 m)   Wt 81.6 kg   LMP 08/15/2021 (Exact Date)   BMI 28.18 kg/m   EFM: Baseline 135 bpm, moderate variability,+ accels, no decels   CVE: Dilation: Lip/rim Effacement (%): 100 Station: -1 Presentation: Vertex Exam by:: [email or web address], RN  [name]: 31 y.o. 38 [redacted]w[redacted]d   #Labor: Progressing well. Now 9.5 cm after prior Cytotec and recent SROM. Fetal head still high. Will labor down and reassess in 1-2 hours.  #Pain: PRN #FWB: Cat 1  #GBS negative  [redacted]w[redacted]d, MD 1:11 AM

## 2022-04-10 NOTE — Lactation Note (Signed)
This note was copied from a baby's chart. Lactation Consultation Note  [name] [id number] Date: 04/10/2022 Reason for consult: Follow-up assessment;Term Age:31 hours   LC Follow Up Consult:  RN requested latch assistance.  Swahili interpreter used for interpretation [phone]).  RN in room assisting, however, baby has not latched.  Reviewed basic teaching completed this a.m. (Read previous note for details).  Assisted to latch easily in the cross cradle hold.  Once at the breast, baby was not at all interested in initiating a suck.  Demonstrated gentle stimulation and breast compression without success.  He continued to sleep.  Mother would benefit from further education on breast feeding basics and, especially, positioning, body alignment, breast support, how to latch and how to hand express.  Reinforce mother's positioning and comfort prior to latching. Placed him STS and he fell asleep.  Reassurance given.  Mother has been trying to force her nipple into his mouth when he has not even opened his mouth for latching.  Educated her on the importance of feeding with cues and hand expressing colostrum drops when he is not ready to feed.  Support person present and 2 visitors entered at the end of my visit.  RN updated.   Maternal Data    Feeding Mother's Current Feeding Choice: Breast Milk and Formula  LATCH Score Latch: Repeated attempts needed to sustain latch, nipple held in mouth throughout feeding, stimulation needed to elicit sucking reflex.  Audible Swallowing: None  Type of Nipple: Everted at rest and after stimulation  Comfort (Breast/Nipple): Soft / non-tender  Hold (Positioning): Assistance needed to correctly position infant at breast and maintain latch.  LATCH Score: 6   Lactation Tools Discussed/Used    Interventions Interventions: Breast feeding basics reviewed;Assisted with latch;Skin to skin;Breast massage;Hand express;Breast  compression;Position options;Support pillows;Adjust position;Education  Discharge    Consult Status Consult Status: Follow-up Date: 04/11/22 Follow-up type: In-patient    Tametha Banning R Clydette Privitera 04/10/2022, 5:04 PM

## 2022-04-10 NOTE — Discharge Summary (Signed)
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 yo [id number] 357w1das admitted to the hospital 04/09/2022 for induction of labor.  Indication for induction: TYPE 2 DM and CHTN .  Patient had an uncomplicated labor course and progressed to complete after Cytotec and SROM.  She had a normal vaginal delivery.  Membrane Rupture Time/Date: 1:07 AM ,04/10/2022   Delivery Method:Vaginal, Spontaneous  Episiotomy: None  Lacerations:  None  Details of delivery can be found in separate delivery note.  Patient had a routine postpartum course.  Her fasting CBG postpartum was 99.  She will continue her home Metformin upon discharge.  Her BP remained within normal limits while inpatient.  She was started on Lasix, which she will continue upon discharge to complete a 5 day course.  She is eating, drinking, voiding, and ambulating without issue.  Her pain and bleeding are controlled.  She is breastfeeding well.  Patient is discharged home 04/11/22.  Newborn Data: Birth date:04/10/2022  Birth time:5:31 AM  Gender:Female  Living status:Living  Apgars:7 ,8   Weight:3920 g   Magnesium Sulfate received: No BMZ received: No Rhophylac: N/A MMR: N/A - Immune  T-DaP: Given prenatally Flu: No Transfusion: No  Physical exam  Vitals:   04/10/22 1259 04/10/22 1731 04/10/22 2040 04/11/22 0608  BP: (!) 133/55 124/78 127/79 (!) 123/55  Pulse: 67 65 94 73  Resp: _0 Temp: 99.1 F (37.3 C) 98.2 F (36.8 C) 98.5 F (36.9 C) 98 F (36.7 C)  TempSrc: Oral Oral Oral Oral  SpO2:   100% 100%  Weight:      Height:       General: alert, cooperative, and no distress Lochia: appropriate Uterine Fundus: firm and below umbilicus  DVT Evaluation: no LE edema or calf tenderness to palpation   Labs: Lab Results  Component Value Date   WBC 6.2 04/09/2022   HGB 10.4 (L) 04/09/2022   HCT 32.9 (L) 04/09/2022   MCV 91.4 04/09/2022   PLT 273 04/09/2022      Latest Ref Rng & Units 11/05/2021   10:41 AM  CMP  Glucose 70 - 99 mg/dL 145    Edinburgh Score:    04/10/2022    9:00 PM  Edinburgh Postnatal Depression Scale Screening Tool  I have been able to laugh and see the funny side of things. 1  I have looked forward with enjoyment to things. 0  I have blamed myself unnecessarily when things went wrong. 0  I have  been anxious or worried for no good reason. 0  I have felt scared or panicky for no good reason. 1  Things have been getting on top of me. 0  I have been so unhappy that I have had difficulty sleeping. 2  I have felt sad or miserable. 0  I have been so unhappy that I have been crying. 0  The thought of harming myself has occurred to me. 0  Edinburgh Postnatal Depression Scale Total 4    After visit meds:  Allergies as of 04/11/2022   No Known Allergies      Medication List     STOP taking these medications    aspirin EC 81 MG tablet       TAKE these medications    Accu-Chek Guide test strip Generic drug: glucose blood To check blood sugars 4 times a day. Fasting and 2 hours after the first bite of breakfast,  lunch and dinner   Accu-Chek Softclix Lancets lancets To check blood sugars 4 times a day as instructed   acetaminophen 500 MG tablet Commonly known as: TYLENOL Take 2 tablets (1,000 mg total) by mouth every 8 (eight) hours as needed (pain).   Blood Pressure Kit Devi 1 each by Does not apply route once a week.   furosemide 20 MG tablet Commonly known as: LASIX Take 1 tablet (20 mg total) by mouth daily for 4 days.   ibuprofen 600 MG tablet Commonly known as: ADVIL Take 1 tablet (600 mg total) by mouth every 6 (six) hours as needed (pain).   metFORMIN 500 MG tablet Commonly known as: GLUCOPHAGE Take 1 tablet (500 mg total) by mouth 2 (two) times daily with a meal.   Prenatal Vitamin 27-0.8 MG Tabs Take 1 Dose by mouth daily.        Discharge home in stable condition Infant Feeding: Breast Infant Disposition: home with mother Discharge instruction: per After Visit Summary and Postpartum booklet. Activity: Advance as tolerated. Pelvic rest for 6 weeks.  Diet: routine diet Future Appointments: Future Appointments  Date Time Provider [place]  04/13/2022 10:35 AM Clarnce Flock, MD [facility]  04/16/2022 10:30 AM [facility] NURSE [facility]  04/16/2022 10:45 AM [facility] NST [facility]  04/20/2022  2:15 PM Clarnce Flock, MD [facility]   Follow up Visit: Message sent to [facility] by Dr. Gwenlyn Perking on 04/10/22.   Please schedule this patient for a In person postpartum visit in 6 weeks with the following provider: Any provider. Additional Postpartum F/U: BP check 1 week  High risk pregnancy complicated by: HTN and M0RF Delivery mode:  Vaginal, Spontaneous  Anticipated Birth Control:  Unsure  AMN Swahili phone interpreter [phone]) used for entirety of encounter.   04/11/2022 [name], MD

## 2022-04-10 NOTE — Lactation Note (Signed)
This note was copied from a baby's chart. Lactation Consultation Note  [name] [id number] Date: 04/10/2022 Reason for consult: Initial assessment;Term Age:31 hours   P5 mother whose infant is now 64 hours old.  This is a term baby at 39+1 weeks.  Mother's current feeding preference is breast/formula.  Mother breast fed her other children (now 2, 62, 2 and 11 years old) for 1-1/2-2 years each.  Swahili interpreter used for interpretation [phone]).  Mother has not latched at all since birth.  She has recently formula fed.  Reviewed breast feeding basics with mother.  Encouraged feeding 8-12 times/24 hours or sooner if baby shows cues.  Suggested lots of STS and hand expressed colostrum drops.  Mother has baby swaddled in multiple layers with a blanket wrap to head.  She is afraid he might be cold.  Newborn basics reviewed.    Encouraged mother to call for latch assistance as needed.  She is a [employer] [facility] participant and plans to call them Monday.  She may accept the formula package.  Support person present.   Maternal Data Has patient been taught Hand Expression?: Yes Does the patient have breastfeeding experience prior to this delivery?: Yes How long did the patient breastfeed?: 1-1/2 - 2 years with each of her other children  Feeding Mother's Current Feeding Choice: Breast Milk and Formula Nipple Type: Nfant Standard Flow (white)  LATCH Score                    Lactation Tools Discussed/Used    Interventions Interventions: Breast feeding basics reviewed;Education;LC Services brochure  Discharge Pump:  (Mother will contact WIC on Monday; she may desire formula from [facility]) WIC Program: Yes  Consult Status Consult Status: Follow-up Date: 04/11/22 Follow-up type: In-patient    Lyrique Hakim R Giorgio Chabot 04/10/2022, 11:47 AM

## 2022-04-10 NOTE — Progress Notes (Signed)
Labor Progress Note Jaelin Pilkenton is a 31 y.o. [id number] at [redacted]w[redacted]d who presented for IOL due to [facility] and T2DM.   S: Doing well. Feeling intermittent pressure with contractions. No concerns.   O:  BP (!) 147/88   Pulse 84   Temp 98.3 F (36.8 C) (Oral)   Resp 16   Ht 5\' 7"  (1.702 m)   Wt 81.6 kg   LMP 08/15/2021 (Exact Date)   BMI 28.18 kg/m   EFM: Baseline 145 bpm, moderate variability, + accels, intermittent variable decels   CVE: Dilation: 10 Dilation Complete Date: 04/10/22 Dilation Complete Time: 0332 Effacement (%): 100 Station: 0 Presentation: Vertex Exam by:: [email or web address], MD  [name]: 31 y.o. 38 [redacted]w[redacted]d   #Labor: Progressing well. Now complete and 0 station. Positioned in throne. Will labor down for 1 hour as tolerated and reassess. #Pain: Maternal support, coping well  #FWB: Cat 2 due to intermittent variable decels with contractions. Reassuring variability and recovers well. Will continue to monitor.  #GBS negative  [redacted]w[redacted]d, MD 3:59 AM

## 2022-04-10 NOTE — Progress Notes (Signed)
Last CBGs 199 and 176. SSI ordered. Will treat per protocol and continue to monitor.   Evalina Field, MD

## 2022-04-11 ENCOUNTER — Telehealth: Payer: Self-pay

## 2022-04-11 LAB — GLUCOSE, CAPILLARY: Glucose-Capillary: 93 mg/dL (ref 70–99)

## 2022-04-11 MED ORDER — ACETAMINOPHEN 500 MG PO TABS: 1000.0000 mg | ORAL_TABLET | Freq: Three times a day (TID) | ORAL | 0 refills | Status: DC | PRN

## 2022-04-11 MED ORDER — IBUPROFEN 600 MG PO TABS: 600.0000 mg | ORAL_TABLET | Freq: Four times a day (QID) | ORAL | 0 refills | Status: DC | PRN

## 2022-04-11 MED ORDER — FUROSEMIDE 20 MG PO TABS
20.0000 mg | ORAL_TABLET | Freq: Every day | ORAL | 0 refills | Status: DC
Start: 2022-04-11 — End: 2022-04-19

## 2022-04-11 NOTE — Telephone Encounter (Signed)
04/10/22  Patient called me with updates that she has delivered a baby boy. Congratulated her. No concerns at this time. I will follow up upon discharge.   Nicole Cella Jeneane Pieczynski RN BSN PCCN  [facility] [phone]-cell [phone]-office

## 2022-04-11 NOTE — Progress Notes (Signed)
Babyscripts set-up attempt made by Clinical research associate. Pt confirmed email address and cell phone number in the chart were correct. Unable to find proper enrollment information to complete set-up in pts personal cell phone. Pt reports email and cell number were her husband's. States she prefers to use husband's info because they are "always together" and he "understands technology more". Husband at home with other children, but scheduled to return around 9/10 AM. Will complete Babyscripts at that time. Interpreter [phone] utilized for teaching.    Elvia Collum, RN 04/11/22

## 2022-04-11 NOTE — Progress Notes (Signed)
Dr. Mathis Fare notified unable to initiate babyscripts because per Swahili interpreter patient did not know Dr. Augustina Mood she goes to [facility] or insurance information. She will be seen in 1 week per Dr. Mathis Fare.

## 2022-04-11 NOTE — Progress Notes (Signed)
Patient was encouraged all day using Swahili interpreter to increase her feeds and feed more often. Per patient baby has not been vomiting anymore.

## 2022-04-11 NOTE — Lactation Note (Signed)
This note was copied from a baby's chart. Lactation Consultation Note  Patient Name: Boy Tijana Walder [id number] Date: 04/11/2022 Reason for consult: Follow-up assessment;Term;Infant weight loss;Hyperbilirubinemia;Breastfeeding assistance (3.32% WL) Age:31 hours  P5, Term, Infant Female, 3.32% WL  Interpreter [phone] Mary used  LC entered the room and mom was holding baby. Mom states that things are going well with baby. LC encouraged mom to feed baby frequently to assist with lowering his bilirubin levels and write down the feedings so that they can be recorded.   Mom states that she was feeding baby frequently, but he was vomiting. She states that she will increase the feedings.   Mom says that she is afraid to remove baby's diaper to change him due to the circumcision. LC told mom that I would get her nurse to assist her.   Current Feeding Plan:  Breastfeed according to feeding cues at least 8+ times per day in 24 hours.  Put baby to the breast prior to supplementing according to supplementation guidelines.  Call for latch assistance as needed.    Lactation Tools Discussed/Used    Interventions Interventions: Breast feeding basics reviewed;Education  Discharge    Consult Status Consult Status: Follow-up Date: 04/12/22 Follow-up type: In-patient    Delene Loll 04/11/2022, 2:35 PM

## 2022-04-12 LAB — BPAM RBC
Blood Product Expiration Date: 202307202359
Blood Product Expiration Date: 202307202359
Unit Type and Rh: 5100
Unit Type and Rh: 5100

## 2022-04-12 LAB — TYPE AND SCREEN
ABO/RH(D): O POS
Antibody Screen: POSITIVE
Donor AG Type: NEGATIVE
Donor AG Type: NEGATIVE
Unit division: 0
Unit division: 0

## 2022-04-12 NOTE — Progress Notes (Signed)
Interpreter Fred [phone] used to communicate with patient and answer any questions.  Discharge instructions for MOB and infant were gone over and all questions were answered.  Note given to patient for husband and patients mother.

## 2022-04-12 NOTE — Lactation Note (Signed)
This note was copied from a baby's chart. Lactation Consultation Note  Patient Name: Boy Jackie Russman BTDHR'C Date: 04/12/2022 Reason for consult: Follow-up assessment;Maternal endocrine disorder Age:31 hours  Merlyn Albert interpreter used via video for [employer]. Baby latched upon entering with intermittent swallows. Reviewed engorgement care and monitoring voids/stools. Mother denies questions or concerns.  Feeding Mother's Current Feeding Choice: Breast Milk and Formula  LATCH Score Latch: Grasps breast easily, tongue down, lips flanged, rhythmical sucking. (latched upon entering)  Audible Swallowing: A few with stimulation  Type of Nipple: Everted at rest and after stimulation  Comfort (Breast/Nipple): Soft / non-tender  Hold (Positioning): Assistance needed to correctly position infant at breast and maintain latch.  LATCH Score: 8   Interventions Interventions: Breast feeding basics reviewed;Education  Discharge Discharge Education: Engorgement and breast care;Warning signs for feeding baby  Consult Status Consult Status: Complete Date: 04/12/22    Dahlia Byes [facility] 04/12/2022, 9:02 AM

## 2022-04-12 NOTE — Discharge Summary (Signed)
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 yo [id number] 31w1das admitted to the hospital 04/09/2022 for induction of labor.  Indication for induction: TYPE 2 DM and CHTN .  Patient had an uncomplicated labor course and progressed to complete after Cytotec and SROM.  She had a normal vaginal delivery.  Membrane Rupture Time/Date: 1:07 AM ,04/10/2022   Delivery Method:Vaginal, Spontaneous  Episiotomy: None  Lacerations:  None  Details of delivery can be found in separate delivery note.  Patient had a routine postpartum course.  Her fasting CBG postpartum was 99.  She will continue her home Metformin upon discharge.  Her BP remained within normal limits while inpatient.  She was started on Lasix, which she will continue upon discharge to complete a 5 day course.  She will have a BP check in the office in 1 week.  She is eating, drinking, voiding, and ambulating without issue.  Her pain is controlled.  Her bleeding is minimal.  She is breastfeeding well.  Patient is discharged home 04/12/22.  Newborn Data: Birth date:04/10/2022  Birth time:5:31  AM  Gender:Female  Living status:Living  Apgars:7 ,8  Weight:3920 g   Magnesium Sulfate received: No BMZ received: No Rhophylac: N/A MMR: N/A - Immune  T-DaP: Given prenatally Flu: No Transfusion: No  Physical exam  Vitals:   04/11/22 0608 04/11/22 1548 04/11/22 2111 04/12/22 0550  BP: (!) 123/55 125/69 120/83 (!) 118/57  Pulse: 73 79 75 67  Resp: '18 18 18 18  ' Temp: 98 F (36.7 C) 97.7 F (36.5 C) 98.4 F (36.9 C) 98.6 F (37 C)  TempSrc: Oral Oral Oral Oral  SpO2: 100% 100% 100%   Weight:      Height:       General: alert, cooperative, and no distress, ambulating around room without issue  Lochia: appropriate Uterine Fundus: firm and below umbilicus, nontendern to palpation  DVT Evaluation: no LE edema or calf tenderness to palpation, normal ROM  Labs: Lab Results  Component Value Date   WBC 6.2 04/09/2022   HGB 10.4 (L) 04/09/2022   HCT 32.9 (L) 04/09/2022   MCV 91.4 04/09/2022   PLT 273 04/09/2022      Latest Ref Rng & Units 11/05/2021   10:41 AM  CMP  Glucose 70 - 99 mg/dL 145    Edinburgh Score:    04/10/2022    9:00 PM  Edinburgh Postnatal Depression Scale Screening Tool  I have been able to laugh and see the funny side of things. 1  I have looked forward with enjoyment to things. 0  I have blamed myself unnecessarily when things went wrong. 0  I have been anxious or worried for no good reason. 0  I have felt scared or panicky for no good reason. 1  Things have been getting on top of me. 0  I have been so unhappy that I have had difficulty sleeping. 2  I have felt sad or miserable. 0  I have been so unhappy that I have been crying. 0  The thought of harming myself has occurred to me. 0  Edinburgh Postnatal Depression Scale Total 4    After visit meds:  Allergies as of 04/12/2022   No Known Allergies      Medication List     STOP taking these medications    aspirin EC 81 MG tablet       TAKE these medications    Accu-Chek Guide  test strip Generic drug: glucose blood To check blood sugars 4 times a day. Fasting and 2 hours after the first bite of breakfast, lunch and dinner   Accu-Chek Softclix Lancets lancets To check blood sugars 4 times a day as instructed   acetaminophen 500 MG tablet Commonly known as: TYLENOL Take 2 tablets (1,000 mg total) by mouth every 8 (eight) hours as needed (pain).   Blood Pressure Kit Devi 1 each by Does not apply route once a week.   furosemide 20 MG tablet Commonly known as: LASIX Take 1 tablet (20 mg total) by mouth daily for 4 days.   ibuprofen 600 MG tablet Commonly known as: ADVIL Take 1 tablet (600 mg total) by mouth every 6 (six) hours as needed (pain).   metFORMIN 500 MG tablet Commonly known as: GLUCOPHAGE Take 1 tablet (500 mg total) by mouth 2 (two) times daily with a meal.   Prenatal Vitamin 27-0.8 MG Tabs Take 1 Dose by mouth daily.        Discharge home in stable condition Infant Feeding: Breast Infant Disposition: home with mother Discharge instruction: per After Visit Summary and Postpartum booklet. Activity: Advance as tolerated. Pelvic rest for 6 weeks.  Diet: routine diet Future Appointments: Future Appointments  Date Time Provider [place]  04/16/2022 10:30 AM [facility] NURSE [facility]  04/16/2022 10:45 AM [facility] NST [facility]  04/19/2022  2:30 PM [facility] NURSE [facility] [place] [facility]  05/24/2022  9:15 AM [name], MD [facility]   Follow up Visit: Message sent to [facility] by Dr. Gwenlyn Perking on 04/10/22.   Please schedule this patient for a In person postpartum visit in 6 weeks with the following provider: Any provider. Additional Postpartum F/U: BP check 1 week  High risk pregnancy complicated by: HTN and Q8GN Delivery mode:  Vaginal, Spontaneous  Anticipated Birth Control:  Unsure  [facility] Swahili video interpreter Katharine Look, [phone]) used for encounter.   04/12/2022 [name], MD

## 2022-04-13 ENCOUNTER — Encounter: Payer: Medicaid Other | Admitting: Family Medicine

## 2022-04-16 ENCOUNTER — Other Ambulatory Visit: Payer: Medicaid Other

## 2022-04-16 ENCOUNTER — Ambulatory Visit: Payer: Medicaid Other

## 2022-04-19 ENCOUNTER — Ambulatory Visit (INDEPENDENT_AMBULATORY_CARE_PROVIDER_SITE_OTHER): Payer: Medicaid Other | Admitting: Pharmacist

## 2022-04-19 ENCOUNTER — Other Ambulatory Visit: Payer: Self-pay

## 2022-04-19 VITALS — BP 129/86

## 2022-04-19 DIAGNOSIS — O1003 Pre-existing essential hypertension complicating the puerperium: Secondary | ICD-10-CM

## 2022-04-19 NOTE — Progress Notes (Addendum)
Postpartum BP Check Visit Note  Patient: Katelyn Sharp MRN: [id number] Date of Birth: 1991/01/26 PCP: Martyn Malay, MD   Mosetta Pigeon 31 y.o. female presents for a postpartum BP check visit today. She delivered baby on 04/10/2022 and has completed 5 days of furosemide postpartum. Otherwise, no other BP medications.   Swahili Firefighter [phone]) utilized for visit.  BP 129/86 (BP Location: Left Arm, Patient Position: Sitting, Cuff Size: Normal)   LMP 08/15/2021 (Exact Date)   Patient Information   Past Medical History:  Diagnosis Date   Essential hypertension    Type 2 diabetes mellitus ([place])    Allergies as of 04/19/2022   No Known Allergies      Medication List        Accurate as of April 19, 2022  2:45 PM. If you have any questions, ask your nurse or doctor.          Accu-Chek Guide test strip Generic drug: glucose blood To check blood sugars 4 times a day. Fasting and 2 hours after the first bite of breakfast, lunch and dinner   Accu-Chek Softclix Lancets lancets To check blood sugars 4 times a day as instructed   acetaminophen 500 MG tablet Commonly known as: TYLENOL Take 2 tablets (1,000 mg total) by mouth every 8 (eight) hours as needed (pain).   Blood Pressure Kit Devi 1 each by Does not apply route once a week.   furosemide 20 MG tablet Commonly known as: LASIX Take 1 tablet (20 mg total) by mouth daily for 4 days.   ibuprofen 600 MG tablet Commonly known as: ADVIL Take 1 tablet (600 mg total) by mouth every 6 (six) hours as needed (pain).   metFORMIN 500 MG tablet Commonly known as: GLUCOPHAGE Take 1 tablet (500 mg total) by mouth 2 (two) times daily with a meal.   Prenatal Vitamin 27-0.8 MG Tabs Take 1 Dose by mouth daily.        Assessment and Plan: Patient's blood pressure today is on the higher end of normal at 129/86 mmHg. Patient is comfortable continuing to take BP at home and will follow-up at routine 6 week postpartum  visit. No other complaints today. Spoke with Dr. Ilda Basset who agrees with current plan.  Thank you for allowing pharmacy to be a part of this patient's care.  Lolita Rieger, PharmD, BCPPS

## 2022-04-20 ENCOUNTER — Encounter: Payer: Medicaid Other | Admitting: Family Medicine

## 2022-05-08 ENCOUNTER — Encounter (HOSPITAL_COMMUNITY): Payer: Self-pay

## 2022-05-08 ENCOUNTER — Ambulatory Visit (HOSPITAL_COMMUNITY)
Admission: EM | Admit: 2022-05-08 | Discharge: 2022-05-08 | Disposition: A | Discharge status: Home / Self Care | Payer: Medicaid Other | Attending: Student | Admitting: Student

## 2022-05-08 DIAGNOSIS — Z789 Other specified health status: Secondary | ICD-10-CM | POA: Diagnosis not present

## 2022-05-08 DIAGNOSIS — H60392 Other infective otitis externa, left ear: Secondary | ICD-10-CM

## 2022-05-08 MED ORDER — OFLOXACIN 0.3 % OT SOLN: 3.0000 [drp] | Freq: Three times a day (TID) | OTIC | 0 refills | Status: AC

## 2022-05-08 NOTE — ED Provider Notes (Signed)
[place]    CSN: [id number] Arrival date & time: 05/08/22  1005      History   Chief Complaint Chief Complaint  Patient presents with   Otalgia    HPI Katelyn Sharp is a 31 y.o. female presenting with left ear pain and itching for 2 days.  Denies other symptoms including cough, congestion, fevers, sore throat.  States the symptoms developed spontaneously, denies trauma to the ear or exposure to water.  Denies hearing changes, dizziness, tinnitus, drainage.  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 Authorizing Provider  ofloxacin (FLOXIN) 0.3 % OTIC solution Place 3 drops into the left ear in the morning, at noon, and at bedtime for 7 days. 05/08/22 05/15/22 Yes [name], PA-C  acetaminophen (TYLENOL) 500 MG tablet Take 2 tablets (1,000 mg total) by mouth every 8  (eight) hours as needed (pain). 04/11/22   [name], MD  Blood Pressure Monitoring (BLOOD PRESSURE KIT) DEVI 1 each by Does not apply route once a week. 11/05/21   [name], MD  ibuprofen (ADVIL) 600 MG tablet Take 1 tablet (600 mg total) by mouth every 6 (six) hours as needed (pain). 04/11/22   [name], MD  metFORMIN (GLUCOPHAGE) 500 MG tablet Take 1 tablet (500 mg total) by mouth 2 (two) times daily with a meal. 12/10/21   [name], MD    Family History Family History  Problem Relation Age of Onset   Healthy Mother    Healthy Father    Bleeding Disorder Neg Hx    Hypertension Neg Hx    Diabetes Neg Hx     Social History Social History   Tobacco Use   Smoking status: Never   Smokeless tobacco: Never  Vaping Use   Vaping Use: Never used  Substance Use Topics   Alcohol use: Never   Drug use: Never     Allergies   Patient has no known allergies.   Review of Systems Review of Systems  HENT:  Positive for ear pain.   All other systems reviewed  and are negative.    Physical Exam Triage Vital Signs ED Triage Vitals  Enc Vitals Group     BP 05/08/22 1025 (!) 145/86     Pulse Rate 05/08/22 1025 68     Resp 05/08/22 1025 16     Temp 05/08/22 1025 98.6 F (37 C)     Temp Source 05/08/22 1025 Oral     SpO2 05/08/22 1025 98 %     Weight --      Height --      Head Circumference --      Peak Flow --      Pain Score 05/08/22 1023 8     Pain Loc --      Pain Edu? --      Excl. in [place]? --    No data found.  Updated Vital Signs BP (!) 145/86 (BP Location: Left Arm)   Pulse 68   Temp 98.6 F (37 C) (Oral)   Resp 16   LMP 08/15/2021 (Exact Date)   SpO2 98%   Breastfeeding Yes   Visual Acuity Right Eye Distance:   Left Eye Distance:   Bilateral Distance:    Right Eye Near:   Left Eye Near:    Bilateral Near:     Physical Exam Vitals reviewed.  Constitutional:      General: She is not in acute distress.    Appearance:  Normal appearance. She is not ill-appearing.  HENT:     Head: Normocephalic and atraumatic.     Right Ear: Hearing, tympanic membrane, ear canal and external ear normal. No drainage, swelling or tenderness. No middle ear effusion. There is no impacted cerumen. Tympanic membrane is not scarred, perforated, erythematous or retracted.     Left Ear: Hearing and external ear normal. Drainage, swelling and tenderness present.     Ears:     Comments: Unable to visualize L TM given extent of swelling and exudate.  Pulmonary:     Effort: Pulmonary effort is normal.  Neurological:     General: No focal deficit present.     Mental Status: She is alert and oriented to person, place, and time.  Psychiatric:        Mood and Affect: Mood normal.        Behavior: Behavior normal.        Thought Content: Thought content normal.        Judgment: Judgment normal.      UC Treatments / Results  Labs (all labs ordered are listed, but only abnormal results are displayed) Labs Reviewed - No data to display  EKG   Radiology No results found.  Procedures Procedures (including critical care time)  Medications Ordered in UC Medications - No data to display  Initial Impression / Assessment and Plan / UC Course  I have reviewed the triage vital signs and the nursing notes.  Pertinent labs & imaging results that were available during my care of the patient were reviewed by me and considered in my medical decision making (see chart for details).     This patient is a very pleasant 31 y.o. year old female presenting with L otitis externa. Afebrile, nontachy. Unable to visualize L TM given extent of swelling. Will proceed with ofloxacin and clean dry ear precautions. Spoke with this patient using translator. ED return precautions discussed. Patient verbalizes understanding and agreement.    Final Clinical Impressions(s) / UC Diagnoses   Final diagnoses:  Other infective acute otitis externa of left  ear  Language barrier     Discharge Instructions      -Ofloxacin drops 3x daily x7 days -Keep ear dry until treatment is complete  -Follow-up symptoms persist after treatment is complete   ED Prescriptions     Medication Sig Dispense Auth. Provider   ofloxacin (FLOXIN) 0.3 % OTIC solution Place 3 drops into the left ear in the morning, at noon, and at bedtime for 7 days. 5 mL [name], PA-C      PDMP not reviewed this encounter.   [name], PA-C 05/08/22 1045

## 2022-05-08 NOTE — ED Triage Notes (Signed)
Pt states via interpretor bilateral  ear pain for the past 2 days. Has not taken anything at home for the pain.

## 2022-05-08 NOTE — Discharge Instructions (Addendum)
-  Ofloxacin drops 3x daily x7 days -Keep ear dry until treatment is complete  -Follow-up symptoms persist after treatment is complete

## 2022-05-24 ENCOUNTER — Other Ambulatory Visit: Payer: Self-pay

## 2022-05-24 ENCOUNTER — Ambulatory Visit (INDEPENDENT_AMBULATORY_CARE_PROVIDER_SITE_OTHER): Payer: Medicaid Other | Admitting: Obstetrics and Gynecology

## 2022-05-24 ENCOUNTER — Encounter: Payer: Self-pay | Admitting: Obstetrics and Gynecology

## 2022-05-24 VITALS — BP 149/89 | HR 66 | Wt 184.3 lb

## 2022-05-24 DIAGNOSIS — I10 Essential (primary) hypertension: Secondary | ICD-10-CM

## 2022-05-24 DIAGNOSIS — Z789 Other specified health status: Secondary | ICD-10-CM | POA: Diagnosis not present

## 2022-05-24 DIAGNOSIS — E119 Type 2 diabetes mellitus without complications: Secondary | ICD-10-CM

## 2022-05-24 MED ORDER — ENALAPRIL MALEATE 5 MG PO TABS: 5.0000 mg | ORAL_TABLET | Freq: Every day | ORAL | 1 refills | Status: DC

## 2022-05-24 NOTE — Progress Notes (Signed)
    Post Partum Visit Note  Katelyn Sharp is a 31 y.o. [id number] female who presents for a postpartum visit. She is 6 weeks postpartum following a normal spontaneous vaginal delivery.  I have fully reviewed the prenatal and intrapartum course. The delivery was at [redacted]w[redacted]d gestational weeks.  Anesthesia: none. Postpartum course has been unremarkable. Baby is doing well. Baby is feeding by both breast and bottle - [employer] . Bleeding no bleeding. Bowel function is normal. Bladder function is normal. Patient is not sexually active. Contraception method is none. Postpartum depression screening: negative.   The pregnancy intention screening data noted above was reviewed. Potential methods of contraception were discussed. The patient elected to proceed with No data recorded.    Health Maintenance Due  Topic Date Due   FOOT EXAM  Never done   OPHTHALMOLOGY EXAM  Never done   URINE MICROALBUMIN  Never done   COVID-19 Vaccine (2 - Pfizer risk series) 02/18/2022   HEMOGLOBIN A1C  05/05/2022    Medical record reviewed  Review of Systems Pertinent items noted in HPI and remainder of comprehensive ROS otherwise negative.  Objective:  LMP 08/15/2021 (Exact Date)    General:  alert   Breasts:  not indicated  Lungs: clear to auscultation bilaterally  Heart:  regular rate and rhythm, S1, S2 normal, no murmur, click, rub or gallop  Abdomen: soft, non-tender; bowel sounds normal; no masses,  no organomegaly   Wound NA  GU exam:  not indicated       Assessment:    There are no diagnoses linked to this encounter.  Nl postpartum exam.  Type 2 DM CHTN  Plan:   Essential components of care per ACOG recommendations:  1.  Mood and well being: Patient with negative depression screening today. Reviewed local resources for support.  - Patient tobacco use? No.   - hx of drug use? No.    2. Infant care and feeding:  -Patient currently breastmilk feeding? Yes -Social determinants of  health (SDOH) reviewed in EPIC. No concerns  3. Sexuality, contraception and birth spacing - Patient does not want a pregnancy in the next year.  Desired family size is uncertain  - Reviewed reproductive life planning. Reviewed contraceptive methods based on pt preferences and effectiveness.  Patient desired Abstinence today.   - Discussed birth spacing of 18 months  4. Sleep and fatigue -Encouraged family/partner/community support of 4 hrs of uninterrupted sleep to help with mood and fatigue  5. Physical Recovery  - Discussed patients delivery and complications. She describes her labor as good. - Patient had a Vaginal, no problems at delivery. Patient had a 1st degree laceration. Perineal healing reviewed. Patient expressed understanding - Patient has urinary incontinence? No. - Patient is safe to resume physical and sexual activity  6.  Health Maintenance - HM due items addressed Yes - Last pap smear  Diagnosis  Date Value Ref Range Status  08/16/2019   Final   - Negative for intraepithelial lesion or malignancy (NILM)   Pap smear not done at today's visit.  -Breast Cancer screening indicated? No.   7. Chronic Disease/Pregnancy Condition follow up:  DM and HTN  Started on Vasoctec 5 mg today Advised to see PCP next month. F/U with [place] in October for yearly  Live interrupter used during today's visit  - PCP follow up  Nettie Elm, MD, [facility] for [facility], [facility]

## 2022-05-24 NOTE — Patient Instructions (Signed)

## 2022-07-13 ENCOUNTER — Telehealth: Payer: Self-pay

## 2022-07-13 NOTE — Telephone Encounter (Signed)
Patient requested assistance to schedule birth control appointment. Appointment scheduled for 08/03/2022 at 10:10am. Educated to take alternative pregnancy preventative measures like condoms. Patient verbalized understanding.  Earlie Server Montrez [place] RN BSN [place] Nurse [phone]

## 2022-07-20 ENCOUNTER — Ambulatory Visit: Payer: Medicaid Other | Admitting: Family Medicine

## 2022-07-20 VITALS — BP 160/98 | HR 64 | Ht 67.0 in | Wt 187.6 lb

## 2022-07-20 DIAGNOSIS — E119 Type 2 diabetes mellitus without complications: Secondary | ICD-10-CM | POA: Diagnosis not present

## 2022-07-20 DIAGNOSIS — Z3009 Encounter for other general counseling and advice on contraception: Secondary | ICD-10-CM | POA: Insufficient documentation

## 2022-07-20 DIAGNOSIS — I10 Essential (primary) hypertension: Secondary | ICD-10-CM

## 2022-07-20 LAB — POCT URINE PREGNANCY: Preg Test, Ur: NEGATIVE

## 2022-07-20 LAB — POCT GLYCOSYLATED HEMOGLOBIN (HGB A1C): HbA1c, POC (controlled diabetic range): 6.6 % (ref 0.0–7.0)

## 2022-07-20 MED ORDER — AMLODIPINE BESYLATE 5 MG PO TABS: 5.0000 mg | ORAL_TABLET | Freq: Every day | ORAL | 0 refills | Status: DC

## 2022-07-20 NOTE — Patient Instructions (Addendum)
Ilikuwa nzuri [place] leo! Haya ndiyo tuliyozungumza:  1. Shinikizo lako la damu lilikuwa juu leo. [place] dawa yako kwa amlodipine. Tafadhali chukua hii kwenye duka la dawa na uichukue mara moja kila siku wakati wa [place]. 2. Tafadhali jaza tena metformin yako kwa ajili ya [place] wa kisukari. [place] hii mara mbili kwa siku na chakula. 3. Una ufuatiliaji mnamo [place] 10 - kwenye programu hii [place] kupokea sindano ya [place] mimba. [place]ind[place] hii hudumu miezi 3. [place] kuja kabla ya mwisho wa kipindi cha miezi 3 ili kupata sindano ya ziada au uko [place] hatari kubwa ya kupata mimba.  Tafadhali nijulishe ikiwa una maswali mengine yoyote.  Dr Marcha Dutton  -------------------------------------------------------------------  It was great to see you today! Here's what we talked about:  Your blood pressure was high today. We are switching your medication to amlodipine. Please pick this up at the pharmacy and take it once every day at bedtime. Please also refill your metformin for your diabetes. Take this two times per day with food. You have a follow up on October 10 - at this appt you can receive the injection to prevent pregnancy. This injection lasts 3 months. You will need to come in before the end of the 3 month period to get an additional injection or you are at a higher risk of getting pregnant.  Please let me know if you have any other questions.  Dr. Marcha Dutton

## 2022-07-20 NOTE — Progress Notes (Signed)
    SUBJECTIVE:   CHIEF COMPLAINT / HPI:   Contraception Wants to discuss contraception since she had her baby not too long ago. LMP 07/10/22. Last sexual activity was 07/16/22. Knows she can use condoms but would rather have injection. Has heard of nexplanon and IUD before, but she is not interested in those methods. She is only interested in the injection form. Would like for it to last for 6 months, but she is still okay with getting it for 3 months.  HTN Elevated today on initial check. Checks at home. She states her top number is around 120s and lower number is 60-80s; however, she states she is currently out of enalapril but is unsure of how long. Notes some fluctuations in BP at home but that it is overall normal.  T2DM A1c 6.6 from 6.0 today. Has not been taking metformin. Does not take BG at home. No change in diet at home.  PERTINENT  PMH / PSH: HTN, T2DM  OBJECTIVE:   BP (!) 160/98   Pulse 64   Ht 5\' 7"  (1.702 m)   Wt 187 lb 9.6 oz (85.1 kg)   SpO2 100%   BMI 29.38 kg/m   General: Alert and oriented, in NAD Skin: Warm, dry HEENT: NCAT, EOM grossly normal, midline nasal septum Cardiac: RRR, no m/r/g appreciated Respiratory: CTAB, breathing and speaking comfortably on RA Abdominal: Nondistended Extremities: Moves all extremities grossly equally Neurological: No gross focal deficit Psychiatric: Appropriate mood and affect  ASSESSMENT/PLAN:   Essential hypertension Uncontrolled. Not on enalapril. Given risk of pregnancy with >7 days since LMP and recent sexual intercourse, will switch regimen to amlodipine 5 mg daily at bedtime. Follow up improvement at next visit on 10/10.  Well controlled type 2 diabetes mellitus ([place]) Stable. Slightly elevated from 6.0 to 6.6 today. Not on metformin. Counseled on obtaining new refill of metformin when she goes to pharmacy for amlodipine. Patient agreeable, will assess medication use at next visit.  Counseling for initiation of  birth control method Unable to initiate depo-provera today given BP, LMP, and recent sexual intercourse, despite negative urine pregnancy test. Discussed need for abstinence from sexual activity for next two weeks until 10/10, at which time another Upreg can be obtained. If negative, and if BP below 160/90 with use of amlodipine, can initiate depo at that time.   Ethelene Hal, MD [place]

## 2022-07-20 NOTE — Assessment & Plan Note (Signed)
Unable to initiate depo-provera today given BP, LMP, and recent sexual intercourse, despite negative urine pregnancy test. Discussed need for abstinence from sexual activity for next two weeks until 10/10, at which time another Upreg can be obtained. If negative, and if BP below 160/90 with use of amlodipine, can initiate depo at that time.

## 2022-07-20 NOTE — Assessment & Plan Note (Signed)
Uncontrolled. Not on enalapril. Given risk of pregnancy with >7 days since LMP and recent sexual intercourse, will switch regimen to amlodipine 5 mg daily at bedtime. Follow up improvement at next visit on 10/10.

## 2022-07-20 NOTE — Assessment & Plan Note (Signed)
Stable. Slightly elevated from 6.0 to 6.6 today. Not on metformin. Counseled on obtaining new refill of metformin when she goes to pharmacy for amlodipine. Patient agreeable, will assess medication use at next visit.

## 2022-08-02 NOTE — Telephone Encounter (Signed)
See telephone note encounter.

## 2022-08-03 ENCOUNTER — Encounter: Payer: Self-pay | Admitting: Family Medicine

## 2022-08-03 ENCOUNTER — Other Ambulatory Visit (HOSPITAL_COMMUNITY)
Admission: RE | Admit: 2022-08-03 | Discharge: 2022-08-03 | Disposition: A | Discharge status: Home / Self Care | Payer: Medicaid Other | Source: Ambulatory Visit | Attending: Family Medicine | Admitting: Family Medicine

## 2022-08-03 ENCOUNTER — Ambulatory Visit: Payer: Medicaid Other | Admitting: Family Medicine

## 2022-08-03 VITALS — BP 124/76 | HR 69 | Ht 67.0 in | Wt 188.4 lb

## 2022-08-03 DIAGNOSIS — E119 Type 2 diabetes mellitus without complications: Secondary | ICD-10-CM

## 2022-08-03 DIAGNOSIS — Z23 Encounter for immunization: Secondary | ICD-10-CM | POA: Diagnosis not present

## 2022-08-03 DIAGNOSIS — Z3042 Encounter for surveillance of injectable contraceptive: Secondary | ICD-10-CM | POA: Diagnosis not present

## 2022-08-03 DIAGNOSIS — Z124 Encounter for screening for malignant neoplasm of cervix: Secondary | ICD-10-CM

## 2022-08-03 DIAGNOSIS — B3731 Acute candidiasis of vulva and vagina: Secondary | ICD-10-CM | POA: Diagnosis not present

## 2022-08-03 DIAGNOSIS — Z3009 Encounter for other general counseling and advice on contraception: Secondary | ICD-10-CM | POA: Diagnosis not present

## 2022-08-03 DIAGNOSIS — Z309 Encounter for contraceptive management, unspecified: Secondary | ICD-10-CM

## 2022-08-03 DIAGNOSIS — I10 Essential (primary) hypertension: Secondary | ICD-10-CM | POA: Diagnosis not present

## 2022-08-03 LAB — POCT WET PREP (WET MOUNT)
Clue Cells Wet Prep Whiff POC: NEGATIVE
Trichomonas Wet Prep HPF POC: ABSENT

## 2022-08-03 LAB — POCT URINE PREGNANCY: Preg Test, Ur: NEGATIVE

## 2022-08-03 MED ORDER — AMLODIPINE BESYLATE 5 MG PO TABS: 5.0000 mg | ORAL_TABLET | Freq: Every day | ORAL | 3 refills | Status: DC

## 2022-08-03 MED ORDER — MEDROXYPROGESTERONE ACETATE 150 MG/ML IM SUSP
150.0000 mg | Freq: Once | INTRAMUSCULAR | Status: AC
  Administered 2022-08-03: 150 mg via INTRAMUSCULAR

## 2022-08-03 MED ORDER — FLUCONAZOLE 150 MG PO TABS: 150.0000 mg | ORAL_TABLET | Freq: Once | ORAL | 0 refills | Status: AC

## 2022-08-03 NOTE — Progress Notes (Signed)
    SUBJECTIVE:   CHIEF COMPLAINT: medication check  HPI:   Katelyn Sharp is a 31 y.o.  with history notable for type 2 diabetes and HTN presenting for follow up.   The patient reports overall she is doing well. Taking amlodipine and metformin as prescribed. No adverse effects.  She is interested in Depo today. Has not had intercourse since last visit.  Patient reports vaginal itching today. No discharge. No concern for infection. She did recently shave the area.   PERTINENT  PMH / PSH/Family/Social History : noted and updated  OBJECTIVE:   BP 124/76   Pulse 69   Ht 5\' 7"  (1.702 m)   Wt 188 lb 6.4 oz (85.5 kg)   SpO2 98%   BMI 29.51 kg/m   Today's weight:  Last Weight  Most recent update: 08/03/2022  9:04 AM    Weight  85.5 kg (188 lb 6.4 oz)            Review of prior weights: Filed Weights   08/03/22 0904  Weight: 188 lb 6.4 oz (85.5 kg)     Cardiac: Regular rate and rhythm. Normal S1/S2. No murmurs, rubs, or gallops appreciated. GU Exam:    External exam: Normal-appearing female external genitalia.  Vaginal exam notable for thick white discharge.  Cervix without discharge or obvious lesion.   Chaperoned examine, RN Katelyn Sharp.   Psych: Pleasant and appropriate    ASSESSMENT/PLAN:   Counseling for initiation of birth control method Pregnancy test negative. Not sexually active since last visit. Depo today. Discussed side effects, timing, efficacy   Well controlled type 2 diabetes mellitus ([place]) Discussed increasing metformin to BID.   Essential hypertension At goal, BMP today.    Vaginal discharge Yeast on wet prep Rx fluconazole  Discussed not shaving and using gentle cleanser   HCM Pap completed Flu given today     Katelyn Singh, MD  [place]

## 2022-08-03 NOTE — Assessment & Plan Note (Signed)
At goal, BMP today.  

## 2022-08-03 NOTE — Progress Notes (Signed)
Patient here today for initial depo injection. Urine pregnancy obtained and was negative. Received order from Dr. Owens Shark to administer depo provera.     Depo given in [place] today.  Site unremarkable & patient tolerated injection.    Next injection due 10/19/22-11/02/2022. Scheduled patient for 10/20/22 in nurse clinic.     Talbot Grumbling, RN

## 2022-08-03 NOTE — Assessment & Plan Note (Signed)
Pregnancy test negative. Not sexually active since last visit. Depo today. Discussed side effects, timing, efficacy

## 2022-08-03 NOTE — Patient Instructions (Addendum)
It was wonderful to see you today.  Please bring ALL of your medications with you to every visit.   Today we talked about:  -Checking blood work --For your itching, I think this is due to yeast. I have sent in a medication to take once.   - Please take your metformin twice a day and your amlodipine once a day   - Please return in 3 months for a Depo injection    -Haynes Kerns ya damu --Bubba Hales, nadhani hii ni kwa sababu ya chachu. Nimetuma dawa ninywe mara moja.  - Tafadhali chukua metformin yako mara mbili kwa siku na amlodipine yako mara moja kwa siku  - Tafadhali rudi baada ya miezi 3 kwa sindano ya DepoPlease follow up in 3 months   Thank you for choosing [facility].   Please call [phone] with any questions about today's appointment.  Please be sure to schedule follow up at the front  desk before you leave today.   Dorris Singh, MD  Family Medicine

## 2022-08-03 NOTE — Assessment & Plan Note (Signed)
Discussed increasing metformin to BID.

## 2022-08-04 LAB — BASIC METABOLIC PANEL
BUN/Creatinine Ratio: 14 (ref 9–23)
BUN: 8 mg/dL (ref 6–20)
CO2: 23 mmol/L (ref 20–29)
Calcium: 9.1 mg/dL (ref 8.7–10.2)
Chloride: 102 mmol/L (ref 96–106)
Creatinine, Ser: 0.59 mg/dL (ref 0.57–1.00)
Glucose: 118 mg/dL — ABNORMAL HIGH (ref 70–99)
Potassium: 4.1 mmol/L (ref 3.5–5.2)
Sodium: 138 mmol/L (ref 134–144)
eGFR: 123 mL/min/{1.73_m2} (ref >59)

## 2022-08-04 LAB — CYTOLOGY - PAP
Adequacy: ABSENT
Comment: NEGATIVE
Diagnosis: NEGATIVE
High risk HPV: NEGATIVE

## 2022-08-05 ENCOUNTER — Encounter: Payer: Self-pay | Admitting: Family Medicine

## 2022-08-16 ENCOUNTER — Ambulatory Visit: Payer: Medicaid Other | Admitting: Obstetrics and Gynecology

## 2022-10-20 ENCOUNTER — Ambulatory Visit (INDEPENDENT_AMBULATORY_CARE_PROVIDER_SITE_OTHER): Payer: Medicaid Other

## 2022-10-20 DIAGNOSIS — Z30013 Encounter for initial prescription of injectable contraceptive: Secondary | ICD-10-CM

## 2022-10-20 MED ORDER — MEDROXYPROGESTERONE ACETATE 150 MG/ML IM SUSP
150.0000 mg | Freq: Once | INTRAMUSCULAR | Status: AC
  Administered 2022-10-20: 150 mg via INTRAMUSCULAR

## 2022-10-20 NOTE — Progress Notes (Signed)
Patient here today for Depo Provera injection and is within her dates.    Last contraceptive appt was 08/03/2022.  Depo given in [facility] today.  Site unremarkable & patient tolerated injection.    Next injection due 01/06/2023-01/20/2023.  Reminder card given.    Patient scheduled for 01/10/2023.

## 2022-11-15 NOTE — Progress Notes (Signed)
Reviewed and agree.

## 2023-01-10 ENCOUNTER — Ambulatory Visit (INDEPENDENT_AMBULATORY_CARE_PROVIDER_SITE_OTHER): Payer: Medicaid Other

## 2023-01-10 DIAGNOSIS — Z3042 Encounter for surveillance of injectable contraceptive: Secondary | ICD-10-CM

## 2023-01-10 MED ORDER — MEDROXYPROGESTERONE ACETATE 150 MG/ML IM SUSY
150.0000 mg | PREFILLED_SYRINGE | Freq: Once | INTRAMUSCULAR | Status: AC
  Administered 2023-01-10: 150 mg via INTRAMUSCULAR

## 2023-01-10 NOTE — Progress Notes (Signed)
Patient here today for Depo Provera injection and is within her dates.    Last contraceptive appt was 08/03/2022  Depo given in [place] today.  Site unremarkable & patient tolerated injection.    Next injection due 03/28/23-04/11/23.  Reminder card given.    Talbot Grumbling, RN

## 2023-03-28 ENCOUNTER — Ambulatory Visit: Payer: Medicaid Other

## 2023-06-12 IMAGING — US US OB COMP LESS 14 WK
1 series · 15 of 28 positions shown · non-contrast
Comparison: None.

CLINICAL DATA: Unknown LMP.

EXAM:
OBSTETRIC <14 WK ULTRASOUND
TECHNIQUE: Transabdominal ultrasound was performed for evaluation of the
gestation as well as the maternal uterus and adnexal regions.

[Series 1: us ob comp less 14 wk · 15 of 69 slices shown]
[im 1/69]
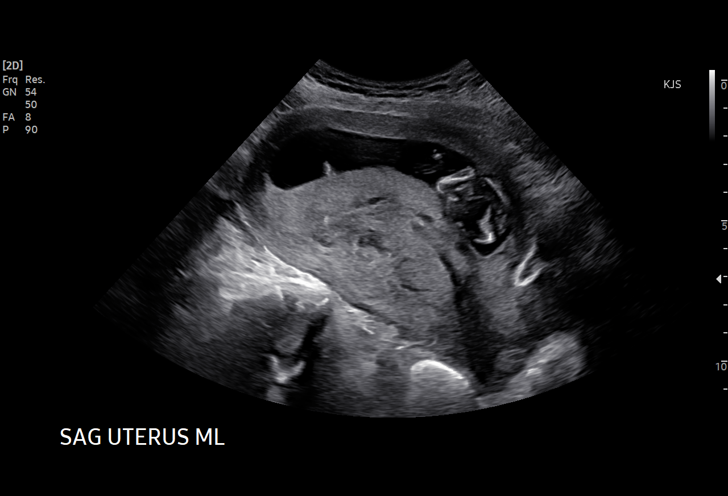
[im 6/69]
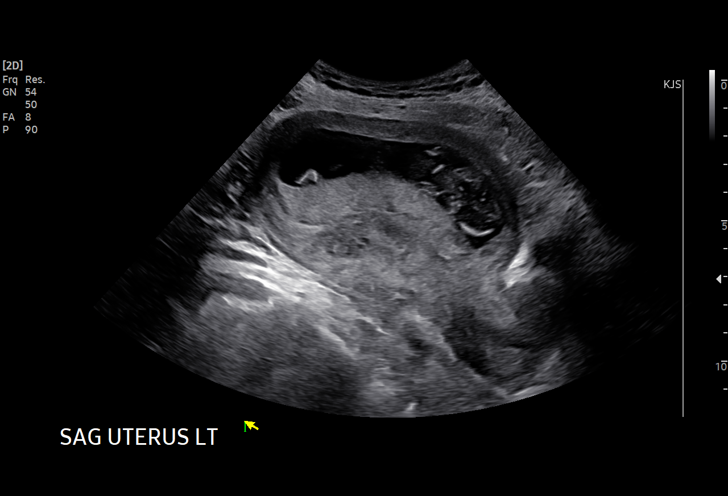
[im 11/69]
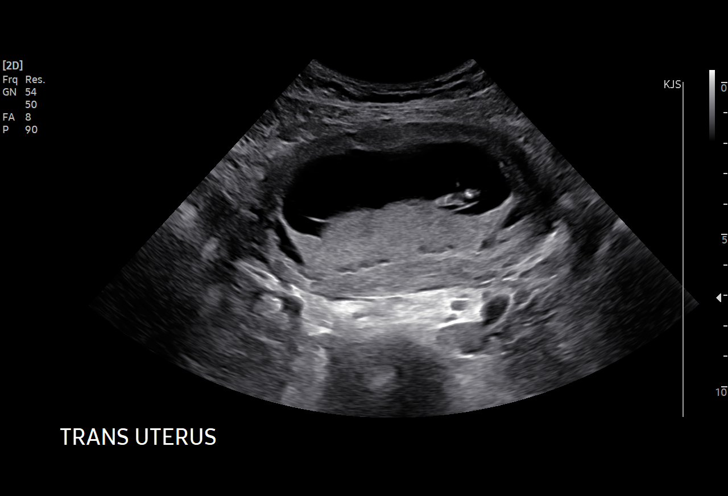
[im 16/69]
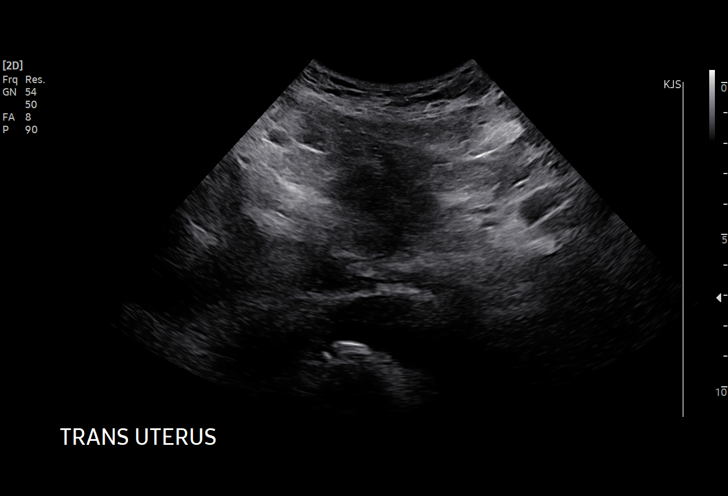
[im 21/69]
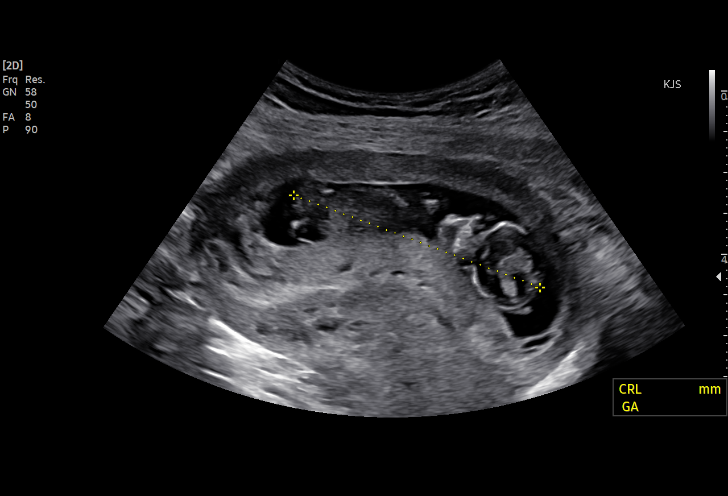
[im 26/69]
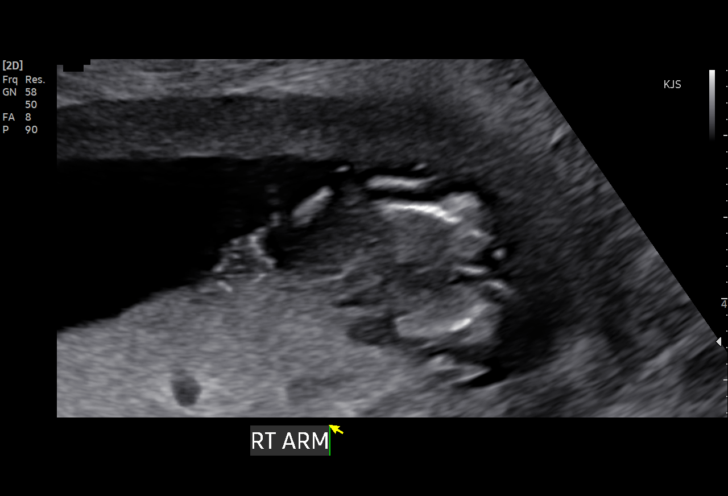
[im 31/69]
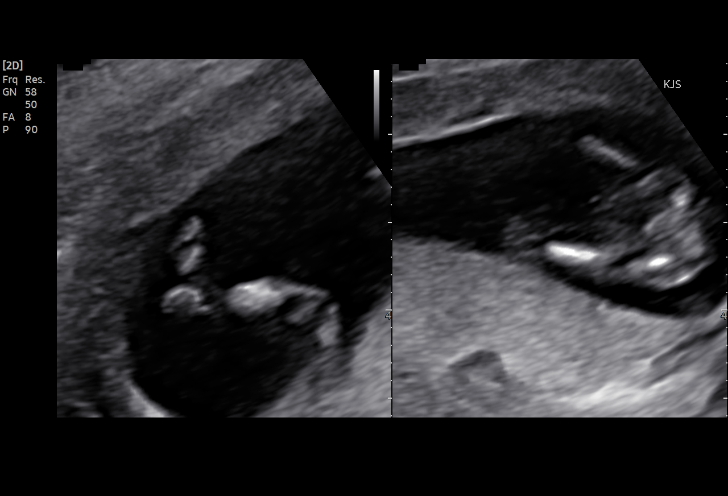
[im 36/69]
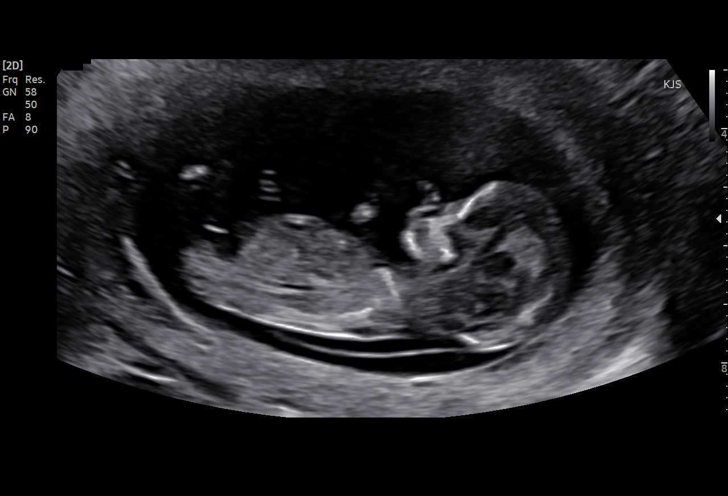
[im 38/69]
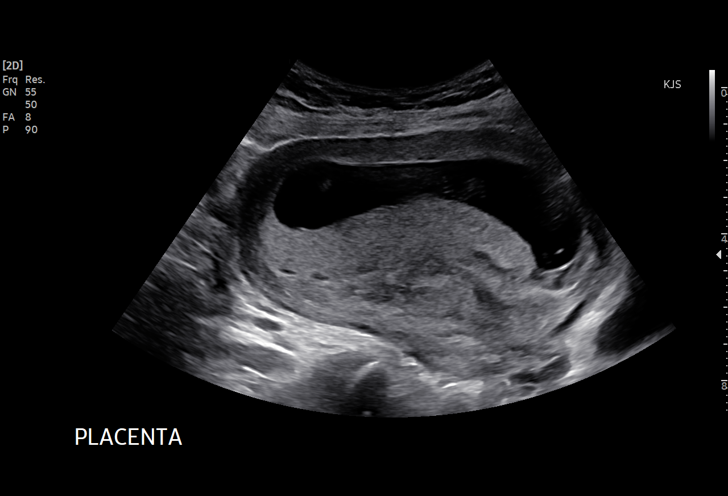
[im 43/69]
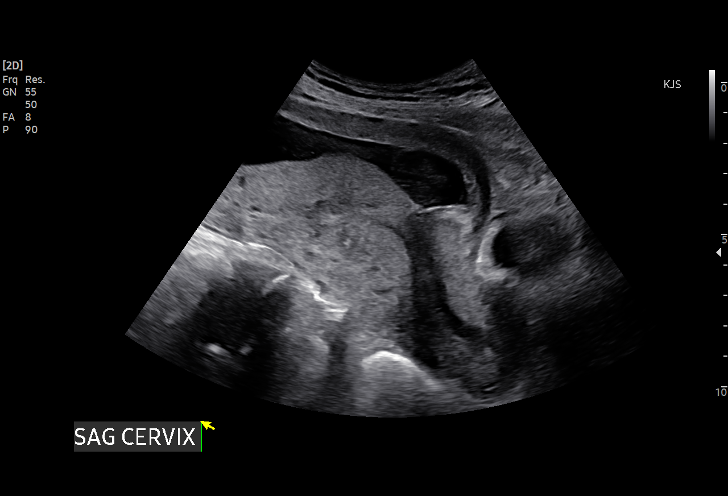
[im 48/69]
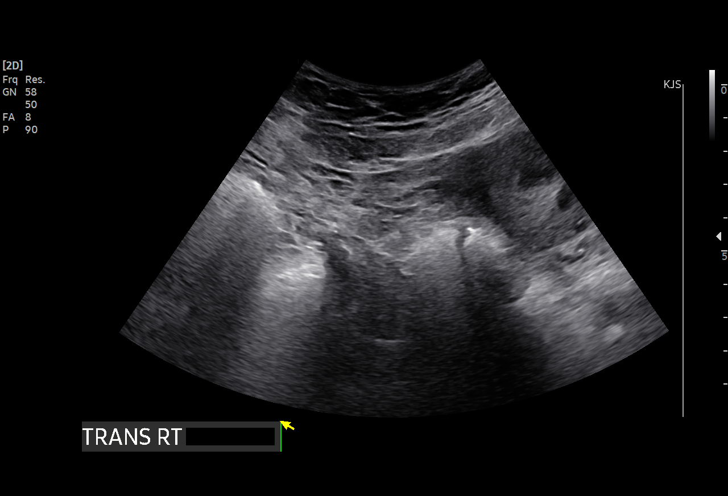
[im 53/69]
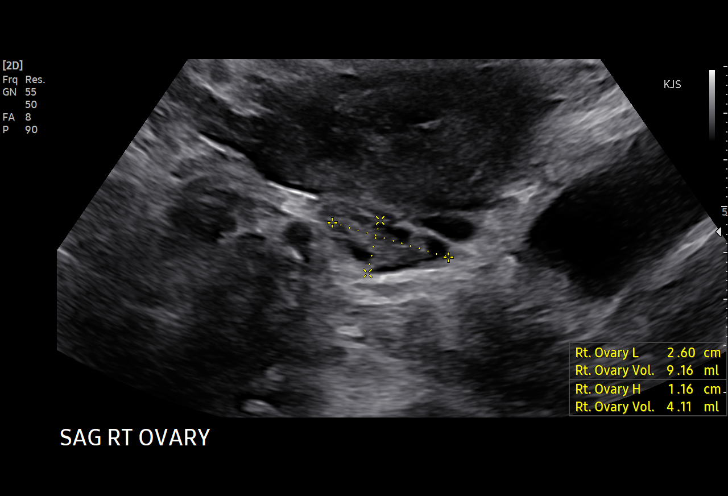
[im 58/69]
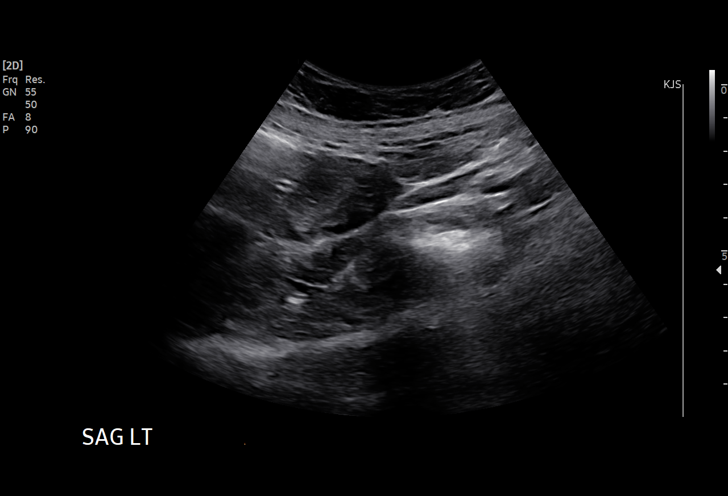
[im 63/69]
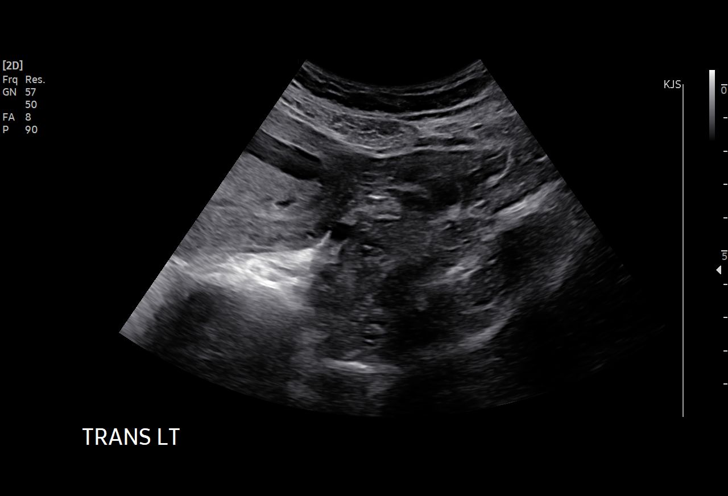
[im 69/69]
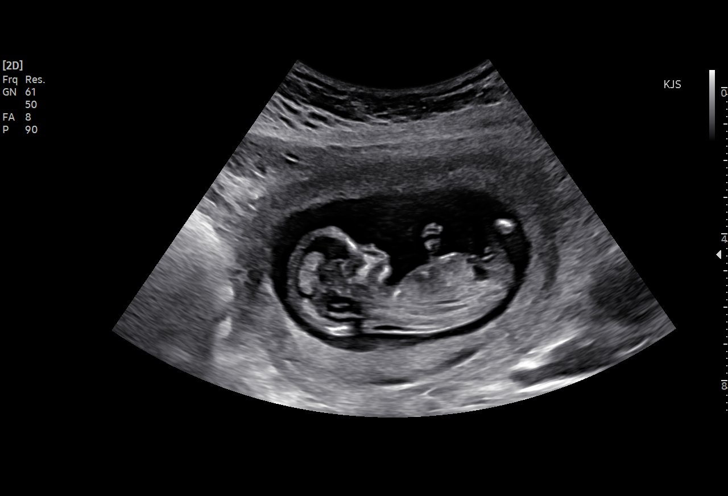

[15 of 28 positions shown; findings below may reference images not displayed]

FINDINGS: Intrauterine gestational sac: Single

Yolk sac:  Not Visualized.

Embryo:  Visualized.

Cardiac Activity: Visualized.

Heart Rate: 152 bpm

CRL:   65 mm   12 w 6 d                  US EDC: 04/16/2022

Subchorionic hemorrhage:  None visualized.

Maternal uterus/adnexae: Both ovaries are normal in appearance. No
mass or abnormal free fluid identified.
IMPRESSION: Single living IUP with estimated gestational age of 12 weeks 6 days,
and US EDC of 04/16/2022.

No maternal uterine or adnexal abnormality identified.

## 2023-08-30 NOTE — Congregational Nurse Program (Signed)
  Dept: [phone]   Congregational Nurse Program Note  Date of Encounter: 08/30/2023  Past Medical History: Past Medical History:  Diagnosis Date   Essential hypertension    Type 2 diabetes mellitus [facility])     Encounter Details:  Community Questionnaire - 08/30/23 1214       Questionnaire   Ask client: Do you give verbal consent for me to treat you today? Yes    Student Assistance [employer] Nurse    Location Patient Served  [facility]    Encounter Setting Phone/Text/Email    Population Status Migrant/Refugee    Insurance Medicaid    Insurance/Financial Assistance Referral N/A    Medication Have Medication Insecurities    Medical Provider Yes    Screening Referrals Made N/A    Medical Referrals Made [facility] P[facility]    Medical Appointment Completed [facility] P[facility]    CNP Interventions Advocate/Support;Navigate Healthcare System;Case Management;Counsel;Educate    Screenings CN Performed N/A    ED Visit Averted Yes    Life-Saving Intervention Made N/A            Patient requesting appointment for missed period. Appointment was made.   Nicole Cella Evely Gainey RN BSN PCCN  [facility] [phone]-cell [phone]-office

## 2023-09-01 ENCOUNTER — Encounter: Payer: Self-pay | Admitting: Student

## 2023-09-01 ENCOUNTER — Ambulatory Visit: Payer: Medicaid Other | Admitting: Student

## 2023-09-01 VITALS — BP 157/95 | HR 63 | Ht 67.0 in | Wt 181.2 lb

## 2023-09-01 DIAGNOSIS — N912 Amenorrhea, unspecified: Secondary | ICD-10-CM | POA: Insufficient documentation

## 2023-09-01 DIAGNOSIS — Z3200 Encounter for pregnancy test, result unknown: Secondary | ICD-10-CM | POA: Diagnosis not present

## 2023-09-01 DIAGNOSIS — I10 Essential (primary) hypertension: Secondary | ICD-10-CM | POA: Diagnosis not present

## 2023-09-01 LAB — POCT URINE PREGNANCY: Preg Test, Ur: NEGATIVE

## 2023-09-01 MED ORDER — HYDROCHLOROTHIAZIDE 25 MG PO TABS
25.0000 mg | ORAL_TABLET | Freq: Every day | ORAL | 3 refills | Status: DC
Start: 2023-09-01 — End: 2024-07-12

## 2023-09-01 NOTE — Assessment & Plan Note (Addendum)
Unclear cause of patient's amenorrhea.  Pregnancy test today was negative and her 2 prior home test were negative.  Broad differential for patient's amenorrhea.  Will obtain lab to further assess possible causes thyroid etiology, prolactinoma, PCOS, or premature ovarian failure. -Obtained lab for TSH, FSH, prolactin. -Ordered pelvic transvaginal ultrasound -Encourage patient to continue use of barrier contraceptive (condom) for the meantime -Follow-up with PCP in 3 weeks.

## 2023-09-01 NOTE — Patient Instructions (Addendum)
Ilikuwa nzuri [place] nawe leo. Asante kwa kuniruhusu kuwa sehemu ya utunzaji wako. Ufuatao ni muhtasari mfupi wa tulichojadili kwenye ziara yako leo:  Kipimo chako cha ujauzito leo kilikuwa hasi.  [place] [name] leo na pia kupata uchunguzi wa fupanyonga wa [place] uke ili kuangalia viungo vyako vilivyo hai.  Pia shinikizo lako la damu leo lilipanda na [place] kutumia dawa [place]. Endelea kutumia amlodipine yako kila siku na dawa mpya ya HCTZ kila siku.  Hakikisha unaendelea kutumia kondomu zisizokubalika [place].  Fuata daktari wako wa huduma ya msingi [employer] 3.  [name] una maswali au wasiwasi wowote, tafadhali usisite kuwasiliana nasi kupitia simu au ujumbe wa MyChart.  Jerre Simon, MD [place] ya [place] ya Familia ya [facility]    It was wonderful to meet you today. Thank you for allowing me to be a part of your care. Below is a short summary of what we discussed at your visit today:  Your pregnancy test today was negative.  We will obtain some labs today and also get a transvaginal Pelvic ultrasound to look at your active organs.  Also your blood pressure today was elevated and started you on a new medication.  Continue to take your amlodipine daily and the new medication HCTZ daily.  Make sure to continue using contraceptive/condoms for intercourse.  Follow-up with your primary care doctor in 3 weeks.  If you have any questions or concerns, please do not hesitate to contact us via phone or MyChart message.   Jerre Simon, MD [facility]

## 2023-09-01 NOTE — Assessment & Plan Note (Signed)
Probably controlled with BP elevated x 2 today.  Patient endorses good compliance and tolerance to her current medication.  Currently asymptomatic. -Continue amlodipine 5 mg daily -Added HCTZ 25 mg daily -Follow-up in 3 weeks to reassess BP. -Encourage patient to continue checking home BPs.

## 2023-09-01 NOTE — Progress Notes (Signed)
    SUBJECTIVE:   CHIEF COMPLAINT / HPI:   Patient is a 32 year old female presenting today for missed period. Per patient LMP was in July 2024 Prior to she has had normal period until June In June and July her period was different, lighter and dark brown lasting a week She is sexually active with her husband and inconsistent with condom use Reports she has been on Depo shot. Per chart review her last Depo shots was on 3/18 And next Depo shots was due 04/24/23-04/11/2023, however patient missed her appt Has had 2 pregnancy test last month that were all negative.  PERTINENT  PMH / PSH: Reviewed  OBJECTIVE:   BP (!) 157/95   Pulse 63   Ht 5\' 7"  (1.702 m)   Wt 181 lb 4 oz (82.2 kg)   LMP 05/26/2023   SpO2 100%   BMI 28.39 kg/m    Physical Exam General: Alert, well appearing, NAD Cardiovascular: RRR, No Murmurs, Normal S2/S2 Respiratory: CTAB, No wheezing or Rales Abdomen: No distension or tenderness  ASSESSMENT/PLAN:   Amenorrhea Unclear cause of patient's amenorrhea.  Pregnancy test today was negative and her 2 prior home test were negative.  Broad differential for patient's amenorrhea.  Will obtain lab to further assess possible causes thyroid etiology, prolactinoma, PCOS, or premature ovarian failure. -Obtained lab for TSH, FSH, prolactin. -Ordered pelvic transvaginal ultrasound -Encourage patient to continue use of barrier contraceptive (condom) for the meantime -Follow-up with PCP in 3 weeks.   Essential hypertension Probably controlled with BP elevated x 2 today.  Patient endorses good compliance and tolerance to her current medication.  Currently asymptomatic. -Continue amlodipine 5 mg daily -Added HCTZ 25 mg daily -Follow-up in 3 weeks to reassess BP. -Encourage patient to continue checking home BPs.     Jerre Simon, MD [facility]

## 2023-09-02 LAB — PROLACTIN: Prolactin: 16.1 ng/mL (ref 4.8–33.4)

## 2023-09-02 LAB — TSH: TSH: 2.63 u[IU]/mL (ref 0.450–4.500)

## 2023-09-02 LAB — FOLLICLE STIMULATING HORMONE: FSH: 8.1 m[IU]/mL

## 2023-09-06 ENCOUNTER — Ambulatory Visit (HOSPITAL_COMMUNITY)
Admission: RE | Admit: 2023-09-06 | Discharge: 2023-09-06 | Disposition: A | Discharge status: Home / Self Care | Payer: Medicaid Other | Source: Ambulatory Visit | Attending: Family Medicine | Admitting: Family Medicine

## 2023-09-06 DIAGNOSIS — N912 Amenorrhea, unspecified: Secondary | ICD-10-CM | POA: Diagnosis not present

## 2023-09-16 NOTE — Progress Notes (Unsigned)
    SUBJECTIVE:   CHIEF COMPLAINT: BP check HPI:   Katelyn Sharp is a 32 y.o.  with history notable for type 2 DM and HTN presenting for follow up. The patient speaks Swahili as their primary language.  An interpreter was used for the entire visit.  Diabetes She reports she is taking her metformin twice a day.  She denies polyuria polydipsia or other symptoms.  Amenorrhea Contraception the patient last had Depo in March.  She reports she actually Depo 4 months ago.  She has not yet gotten her menses.  She has no signs or symptoms of pregnancy today and has had more negative home pregnancy test.  We reviewed her normal labs and normal ultrasound.  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  She has had progressive weight gain since arrival [place].  She is not sure she wants more kids but does not want contraception at this time.  She is not taking a prenatal.   The patient's hypertension she denies headaches, chest pain or vision changes.  She is only taking her amlodipine she picked up her HCTZ but did not start it.  PERTINENT  PMH / PSH/Family/Social History : Still working at [employer] is good   OBJECTIVE:   BP (!) 150/90   Pulse 71   Wt 184 lb (83.5 kg)   LMP 05/26/2023   SpO2 100%   BMI 28.82 kg/m   Today's weight:  Last Weight  Most recent update: 09/19/2023  8:19 AM    Weight  83.5 kg (184 lb)            Review of prior weights: [employer]   09/19/23 0819  Weight: 184 lb (83.5 kg)     Cardiac: Regular rate and rhythm. Normal S1/S2. No murmurs, rubs, or gallops appreciated. Lungs: Clear bilaterally to ascultation.  Abdomen: Normoactive bowel sounds. No tenderness to deep or light palpation. No rebound or guarding.    Psych: Pleasant and appropriate    ASSESSMENT/PLAN:   Assessment & Plan Essential hypertension Not at goal she is going to start her HCTZ.  BMP today as it has been over a year since  her last 1.  Repeat at follow-up.  She is going to bring all her medications to her next visit we will review how to take them.  I did discuss that HCTZ is contraindicated in pregnancy and that she should stop the should she become pregnant. Well controlled type 2 diabetes mellitus (HCC) Continue metformin therapy lipid panel today although she is too young and currently desires pregnancy still not prescribed a statin but will make lifestyle medication changes based upon this.  UACR was obtained today.  At her next visit we will complete a foot exam.  I referred her to ophthalmology. Counseling for initiation of birth control method Reviewed results of normal labs and ultrasound Discussed pregnancy plans Prenatal prescribed Discussed at length the patient is under the impression that since her labs are normal her.  She returned.  I suspect she has underlying possible component of PCOS or amenorrhea related to her obesity.  Will continue to discuss and address this.  I did offer a referral to OB/GYN for further evaluation she does not desire this at this time.  At follow-up consider a pelvic exam.  Terisa Starr, MD  Family Medicine Teaching Service  [facility]

## 2023-09-19 ENCOUNTER — Ambulatory Visit: Payer: Medicaid Other | Admitting: Family Medicine

## 2023-09-19 ENCOUNTER — Encounter: Payer: Self-pay | Admitting: Family Medicine

## 2023-09-19 VITALS — BP 150/90 | HR 71 | Wt 184.0 lb

## 2023-09-19 DIAGNOSIS — Z3009 Encounter for other general counseling and advice on contraception: Secondary | ICD-10-CM | POA: Diagnosis not present

## 2023-09-19 DIAGNOSIS — I10 Essential (primary) hypertension: Secondary | ICD-10-CM | POA: Diagnosis not present

## 2023-09-19 DIAGNOSIS — Z7984 Long term (current) use of oral hypoglycemic drugs: Secondary | ICD-10-CM | POA: Diagnosis not present

## 2023-09-19 DIAGNOSIS — E119 Type 2 diabetes mellitus without complications: Secondary | ICD-10-CM | POA: Diagnosis not present

## 2023-09-19 LAB — POCT GLYCOSYLATED HEMOGLOBIN (HGB A1C): HbA1c, POC (prediabetic range): 6.7 % — AB (ref 5.7–6.4)

## 2023-09-19 MED ORDER — PRENATAL 19 PO CHEW: 1.0000 | CHEWABLE_TABLET | Freq: Every day | ORAL | 3 refills | Status: DC

## 2023-09-19 NOTE — Assessment & Plan Note (Signed)
Continue metformin therapy lipid panel today although she is too young and currently desires pregnancy still not prescribed a statin but will make lifestyle medication changes based upon this.  UACR was obtained today.  At her next visit we will complete a foot exam.  I referred her to ophthalmology.

## 2023-09-19 NOTE — Assessment & Plan Note (Addendum)
Not at goal she is going to start her HCTZ.  BMP today as it has been over a year since her last 1.  Repeat at follow-up.  She is going to bring all her medications to her next visit we will review how to take them.  I did discuss that HCTZ is contraindicated in pregnancy and that she should stop the should she become pregnant.

## 2023-09-19 NOTE — Assessment & Plan Note (Addendum)
Reviewed results of normal labs and ultrasound Discussed pregnancy plans Prenatal prescribed Discussed at length the patient is under the impression that since her labs are normal her.  She returned.  I suspect she has underlying possible component of PCOS or amenorrhea related to her obesity.  Will continue to discuss and address this.  I did offer a referral to OB/GYN for further evaluation she does not desire this at this time.

## 2023-09-20 ENCOUNTER — Encounter: Payer: Self-pay | Admitting: Student

## 2023-09-20 LAB — LIPID PANEL
Chol/HDL Ratio: 3.9 {ratio} (ref 0.0–4.4)
Cholesterol, Total: 157 mg/dL (ref 100–199)
HDL: 40 mg/dL (ref >39)
LDL Chol Calc (NIH): 97 mg/dL (ref 0–99)
Triglycerides: 108 mg/dL (ref 0–149)
VLDL Cholesterol Cal: 20 mg/dL (ref 5–40)

## 2023-09-20 LAB — BASIC METABOLIC PANEL
BUN/Creatinine Ratio: 14 (ref 9–23)
BUN: 11 mg/dL (ref 6–20)
CO2: 21 mmol/L (ref 20–29)
Calcium: 8.8 mg/dL (ref 8.7–10.2)
Chloride: 103 mmol/L (ref 96–106)
Creatinine, Ser: 0.79 mg/dL (ref 0.57–1.00)
Glucose: 89 mg/dL (ref 70–99)
Potassium: 4.7 mmol/L (ref 3.5–5.2)
Sodium: 138 mmol/L (ref 134–144)
eGFR: 102 mL/min/{1.73_m2} (ref >59)

## 2023-09-20 LAB — MICROALBUMIN / CREATININE URINE RATIO
Creatinine, Urine: 163 mg/dL
Microalb/Creat Ratio: 5 mg/g{creat} (ref 0–29)
Microalbumin, Urine: 8.7 ug/mL

## 2023-09-21 ENCOUNTER — Encounter: Payer: Self-pay | Admitting: Family Medicine

## 2023-10-17 NOTE — Progress Notes (Addendum)
    SUBJECTIVE:   CHIEF COMPLAINT: sugars  HPI:   Katelyn Sharp is a 32 y.o.  with history notable for type 2 DM  presenting for follow up The patient speaks Swahili as their primary language.  An interpreter was used for the entire visit.   The patient reports she did not pick up amlodipine and metformin. Denies nausea, vomiting, polyuria, polydipsia.  She has been working on cutting out sugars. She does drink sweet tea and juice. Skips breakfast. Had juice and rice for dinner. She is interested in changing diet.    In terms of her period, she reports she had her period in November. It was normal. Has not yet picked up prenatal. Not interested in contraception.   HTN She reports  taking her hydrochlorothiazide but not amlodipine. Denies HA, CP, dyspnea. She is aware she cannot take hydrochlorothiazide in pregnancy.    PERTINENT  PMH / PSH/Family/Social History : DM-- used insulin in pregnancy   OBJECTIVE:   BP 120/72   Pulse 65   Ht 5\' 7"  (1.702 m)   Wt 189 lb 3.2 oz (85.8 kg)   SpO2 98%   BMI 29.63 kg/m   Today's weight:  Last Weight  Most recent update: 10/20/2023  9:01 AM    Weight  85.8 kg (189 lb 3.2 oz)            Review of prior weights: Filed Weights   10/20/23 0900  Weight: 189 lb 3.2 oz (85.8 kg)   RRR Lungs clear bilaterally Sensate on monofilament  + scaling and white plaques between digits on feet   ASSESSMENT/PLAN:   Assessment & Plan Essential hypertension Sent in amlodipine At repeat measurement she was at goal May be able to titrate off one agent in future (would stop HCTZ, not on ARB as not using birth control)  Type 2 diabetes mellitus with hyperglycemia, without long-term current use of insulin (HCC) Discussed at length Discussed dietary changes, hand out given in Swahili Referral to Nutrition  Foot exam complete Sent in metformin 500 BID- please also titrate this up   Addendum--called pharmacy and cancelled insulin-- initially  misread A1c at 14---discussed and apologized to patient. She was very understanding. All questions answered. Follow up 1/27  Tinea pedis of both feet Rx ketoconazole    Terisa Starr, MD  Family Medicine Teaching Service  [facility]

## 2023-10-20 ENCOUNTER — Encounter: Payer: Self-pay | Admitting: Family Medicine

## 2023-10-20 ENCOUNTER — Ambulatory Visit: Payer: Medicaid Other | Admitting: Family Medicine

## 2023-10-20 VITALS — BP 120/72 | HR 65 | Ht 67.0 in | Wt 189.2 lb

## 2023-10-20 DIAGNOSIS — E1165 Type 2 diabetes mellitus with hyperglycemia: Secondary | ICD-10-CM

## 2023-10-20 DIAGNOSIS — B353 Tinea pedis: Secondary | ICD-10-CM

## 2023-10-20 DIAGNOSIS — I10 Essential (primary) hypertension: Secondary | ICD-10-CM

## 2023-10-20 MED ORDER — KETOCONAZOLE 2 % EX CREA: 1.0000 | TOPICAL_CREAM | Freq: Every day | CUTANEOUS | 0 refills | Status: DC

## 2023-10-20 MED ORDER — BD PEN NEEDLE MICRO U/F 32G X 6 MM MISC: 1 refills | Status: DC

## 2023-10-20 MED ORDER — ACCU-CHEK AVIVA PLUS W/DEVICE KIT: PACK | 0 refills | Status: DC

## 2023-10-20 MED ORDER — AMLODIPINE BESYLATE 5 MG PO TABS: 5.0000 mg | ORAL_TABLET | Freq: Every day | ORAL | 3 refills | Status: DC

## 2023-10-20 MED ORDER — ACCU-CHEK SOFTCLIX LANCETS MISC: 12 refills | Status: DC

## 2023-10-20 MED ORDER — METFORMIN HCL 500 MG PO TABS: 500.0000 mg | ORAL_TABLET | Freq: Two times a day (BID) | ORAL | 5 refills | Status: DC

## 2023-10-20 MED ORDER — BASAGLAR KWIKPEN 100 UNIT/ML ~~LOC~~ SOPN: 10.0000 [IU] | PEN_INJECTOR | SUBCUTANEOUS | 11 refills | Status: DC

## 2023-10-20 MED ORDER — ACCU-CHEK AVIVA PLUS VI STRP: ORAL_STRIP | 12 refills | Status: DC

## 2023-10-20 NOTE — Patient Instructions (Addendum)
It was wonderful to see you today.  Please bring ALL of your medications with you to every visit.   Today we talked about:  I sent the following to your pharmacy - Insulin - Supplies - Glucometer to check sugar  Please check your sugar every morning. If it is below 100, please call our office at [phone]. After you check your sugar inject insulin 10 units each day  Please go to the pharmacy today Pick up your metformin to take 2 times per day  Please pick up your amlodipine to take once per day  Continue you other blood pressure pill      Please call to schedule your eye doctor visit Phone: [phone]  Please avoid sugary beverages like juice and sweet tea I recommend drinking only water  Limit amount of ugali--this is okay to have in moderation but not every meal  You will be called about a visit with a nutritionist   Please follow up in 1 months   Thank you for choosing [facility].   Please call [phone] with any questions about today's appointment.  Please be sure to schedule follow up at the front  desk before you leave today.   Terisa Starr, MD  Family Medicine  \ Candis Shine nzuri [place] leo.  Tafadhali leta dawa zako ZOTE kwa kila ziara.   Simonne Come tulizungumza juu ya:  Nilituma zifuatazo kwenye duka lako la dawa - Insulini - Vifaa - Glucometer kuangalia sukari  Tafadhali angalia sukari yako kila asubuhi. Ikiwa iko chini ya 100, tafadhali piga simu ofisini kwetu kwa [phone]. Baada ya kuangalia sukari yako ingiza insulini vitengo 10 kila siku  Tafadhali nenda kwa duka la dawa leo [place] metformin [place] 2 kwa siku  Tafadhali chukua amlodipine yako Chance moja kwa siku  Endelea na vidonge vingine vya shinikizo la damu      Tafadhali piga simu kupanga ratiba Laurence Compton Simu: [phone]  Tafadhali epuka vinywaji vyenye sukari kama juisi na chai tamu [place]keza kunywa  maji tu  Kikomo cha ugali--hii ni sawa kuwa na kiasi lakini si kila mlo  Utaitwa kuhusu ziara na mtaalamu wa lishe   Tafadhali fuatilia baada ya mwezi 1   Asante kwa kuchagua Dawa ya Familia ya [place].   Edmonia Lynch piga simu [phone] na maswali yoyote kuhusu miadi ya leo.  Tafadhali IRJJOACZY umepanga ufuatiliaji kwenye dawati la mbele kabla ya [place] leo.   Terisa Starr, MD  [facility]

## 2023-10-20 NOTE — Assessment & Plan Note (Addendum)
Sent in amlodipine At repeat measurement she was at goal May be able to titrate off one agent in future (would stop HCTZ, not on ARB as not using birth control)

## 2023-10-20 NOTE — Assessment & Plan Note (Addendum)
Discussed at length Discussed dietary changes, hand out given in Swahili Referral to Nutrition  Foot exam complete Sent in metformin 500 BID- please also titrate this up   Addendum--called pharmacy and cancelled insulin-- initially misread A1c at 14---discussed and apologized to patient. She was very understanding. All questions answered. Follow up 1/27

## 2023-10-21 ENCOUNTER — Ambulatory Visit: Payer: Medicaid Other | Admitting: Family Medicine

## 2023-10-21 ENCOUNTER — Telehealth: Payer: Self-pay | Admitting: Family Medicine

## 2023-10-21 ENCOUNTER — Encounter: Payer: Self-pay | Admitting: Family Medicine

## 2023-10-21 LAB — BASIC METABOLIC PANEL
BUN/Creatinine Ratio: 13 (ref 9–23)
BUN: 9 mg/dL (ref 6–20)
CO2: 23 mmol/L (ref 20–29)
Calcium: 9.2 mg/dL (ref 8.7–10.2)
Chloride: 104 mmol/L (ref 96–106)
Creatinine, Ser: 0.7 mg/dL (ref 0.57–1.00)
Glucose: 133 mg/dL — ABNORMAL HIGH (ref 70–99)
Potassium: 4.1 mmol/L (ref 3.5–5.2)
Sodium: 142 mmol/L (ref 134–144)
eGFR: 118 mL/min/{1.73_m2} (ref >59)

## 2023-10-21 NOTE — Addendum Note (Signed)
Addended by: Manson Passey, Tessah Patchen on: 10/21/2023 10:06 AM   Modules accepted: Orders

## 2023-10-21 NOTE — Telephone Encounter (Signed)
Swahili interpreter used to call patient and correct A1C findings and medications.  Terisa Starr, MD  Family Medicine Teaching Service

## 2023-11-01 ENCOUNTER — Ambulatory Visit: Payer: Medicaid Other | Admitting: Pharmacist

## 2023-11-18 NOTE — Progress Notes (Deleted)
    SUBJECTIVE:   CHIEF COMPLAINT: check medications  HPI:   Katelyn Sharp is a 33 y.o.  with history notable for well controlled DM presenting for check up. The patient speaks Swahili  as their primary language.  An interpreter was used for the entire visit.  Marland Kitchen   PERTINENT  PMH / PSH/Family/Social History : ***  OBJECTIVE:   There were no vitals taken for this visit.  Today's weight:  Review of prior weights: There were no vitals filed for this visit.  ***  ASSESSMENT/PLAN:   Assessment & Plan Type 2 diabetes mellitus with hyperglycemia, without long-term current use of insulin (HCC)  Essential hypertension  Amenorrhea    Terisa Starr, MD  Family Medicine Teaching Service  [facility]

## 2023-11-21 ENCOUNTER — Ambulatory Visit: Payer: Medicaid Other | Admitting: Family Medicine

## 2023-11-21 DIAGNOSIS — E1165 Type 2 diabetes mellitus with hyperglycemia: Secondary | ICD-10-CM

## 2023-11-21 DIAGNOSIS — I10 Essential (primary) hypertension: Secondary | ICD-10-CM

## 2023-11-21 DIAGNOSIS — N912 Amenorrhea, unspecified: Secondary | ICD-10-CM

## 2023-12-13 ENCOUNTER — Ambulatory Visit: Payer: Medicaid Other | Admitting: Dietician

## 2024-01-19 ENCOUNTER — Ambulatory Visit: Payer: Medicaid Other | Admitting: Skilled Nursing Facility1

## 2024-01-24 ENCOUNTER — Encounter: Attending: Family Medicine | Admitting: Skilled Nursing Facility1

## 2024-01-24 ENCOUNTER — Encounter: Payer: Self-pay | Admitting: Skilled Nursing Facility1

## 2024-01-24 DIAGNOSIS — E1165 Type 2 diabetes mellitus with hyperglycemia: Secondary | ICD-10-CM | POA: Diagnosis not present

## 2024-01-24 NOTE — Progress Notes (Signed)
 [facility] interpret 22 with telephone [employer].  DM medications: Metformin   Other Dx: HTN  Pt states she does not have a glucometer. 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   Diabetes Self-Management Education  Visit Type: First/Initial  Appt. Start Time: 7:38 Appt. End Time: 8:38  01/24/2024  [name], identified by name and date of birth, is a 33 y.o. female with a diagnosis of Diabetes: Type 2.   ASSESSMENT  currently breastfeeding. There is no height or weight on file to calculate BMI.  Pt states she will reduce her portion of fufu and slices of bread.    Diabetes Self-Management Education - 01/24/24 0749       Visit Information   Visit Type First/Initial      Initial Visit   Diabetes Type Type 2    Are you taking your medications as prescribed? Yes      Health Coping   How would you rate your overall health? Good      Psychosocial Assessment   Patient Belief/Attitude about Diabetes Motivated to manage diabetes    What is the hardest part about your diabetes right now, causing you the most concern, or is the most worrisome to you about your diabetes?   Making healty food and beverage choices    Self-management support Family    Patient Concerns Nutrition/Meal planning    Special Needs None    Preferred Learning Style Visual;Auditory    Learning Readiness Contemplating    How often do you need to have someone help you when you read instructions, pamphlets, or other written materials from your doctor or pharmacy? 5 - Always      Pre-Education Assessment   Patient understands the diabetes disease and treatment process. Needs Instruction    Patient understands incorporating nutritional management into lifestyle. Needs Instruction    Patient undertands incorporating physical activity into lifestyle. Needs Instruction    Patient understands using medications safely. Needs Instruction    Patient understands monitoring blood glucose, interpreting  and using results Needs Instruction    Patient understands prevention, detection, and treatment of acute complications. Needs Instruction    Patient understands prevention, detection, and treatment of chronic complications. Needs Instruction    Patient understands how to develop strategies to address psychosocial issues. Needs Instruction    Patient understands how to develop strategies to promote health/change behavior. Needs Instruction      Complications   Last HgB A1C per patient/outside source 6.7 %    How often do you check your blood sugar? 0 times/day (not testing)    Have you had a dilated eye exam in the past 12 months? No    Have you had a dental exam in the past 12 months? No    Are you checking your feet? No      Dietary Intake   Breakfast 9-10am tea + sugar + milk + 3-4 slices bread    Lunch 1-2pm fufu (flour) + meat or vegetables + rice + beans    Dinner rice or fruit; 1-2pm fufu (flour) + meat or vegetables + rice + beans    Snack (evening) milk + bread    Beverage(s) tea, milk, water      Activity / Exercise   Activity / Exercise Type Light (walking / raking leaves)    How many days per week do you exercise? 1    How many minutes per day do you exercise? 30    Total minutes  per week of exercise 30      Patient Education   Previous Diabetes Education No    Disease Pathophysiology Factors that contribute to the development of diabetes    Healthy Eating Plate Method;Role of diet in the treatment of diabetes and the relationship between the three main macronutrients and blood glucose level;Reviewed blood glucose goals for pre and post meals and how to evaluate the patients' food intake on their blood glucose level.;Meal timing in regards to the patients' current diabetes medication.;Meal options for control of blood glucose level and chronic complications.    Being Active Role of exercise on diabetes management, blood pressure control and cardiac health.;Identified with  patient nutritional and/or medication changes necessary with exercise.    Medications Reviewed patients medication for diabetes, action, purpose, timing of dose and side effects.    Monitoring Purpose and frequency of SMBG.;Daily foot exams;Yearly dilated eye exam;Identified appropriate SMBG and/or A1C goals.    Acute complications Taught prevention, symptoms, and  treatment of hypoglycemia - the 15 rule.;Discussed and identified patients' prevention, symptoms, and treatment of hyperglycemia.    Chronic complications Dental care;Nephropathy, what it is, prevention of, the use of ACE, ARB's and early detection of through urine microalbumia.;Retinopathy and reason for yearly dilated eye exams;Lipid levels, blood glucose control and heart disease;Assessed and discussed foot care and prevention of foot problems    Diabetes Stress and Support Helped patient identify a support system for diabetes management;Role of stress on diabetes      Individualized Goals (developed by patient)   Nutrition Carb counting;General guidelines for healthy choices and portions discussed;Follow meal plan discussed    Physical Activity Exercise 5-7 days per week;30 minutes per day    Medications take my medication as prescribed    Problem Solving Eating Pattern      Post-Education Assessment   Patient understands the diabetes disease and treatment process. Demonstrates understanding / competency    Patient understands incorporating nutritional management into lifestyle. Demonstrates understanding / competency    Patient undertands incorporating physical activity into lifestyle. Demonstrates understanding / competency    Patient understands using medications safely. Demonstrates understanding / competency    Patient understands monitoring blood glucose, interpreting and using results Demonstrates understanding / competency    Patient understands prevention, detection, and treatment of acute complications. Demonstrates  understanding / competency    Patient understands prevention, detection, and treatment of chronic complications. Demonstrates understanding / competency    Patient understands how to develop strategies to address psychosocial issues. Demonstrates understanding / competency    Patient understands how to develop strategies to promote health/change behavior. Demonstrates understanding / competency      Outcomes   Expected Outcomes Demonstrated interest in learning but significant barriers to change    Future DMSE 4-6 wks    Program Status Completed             Individualized Plan for Diabetes Self-Management Training:   Learning Objective:  Patient will have a greater understanding of diabetes self-management. Patient education plan is to attend individual and/or group sessions per assessed needs and concerns.    Expected Outcomes:  Demonstrated interest in learning but significant barriers to change  Education material provided: ADA - How to Thrive: A Guide for Your Journey with Diabetes, My Plate, and Snack sheet in Swahili   If problems or questions, patient to contact team via:  Phone  Future DSME appointment: 4-6 wks

## 2024-04-06 ENCOUNTER — Encounter (HOSPITAL_COMMUNITY): Payer: Self-pay | Admitting: Emergency Medicine

## 2024-04-06 ENCOUNTER — Ambulatory Visit (HOSPITAL_COMMUNITY): Admission: EM | Admit: 2024-04-06 | Discharge: 2024-04-06 | Disposition: A | Discharge status: Home / Self Care

## 2024-04-06 DIAGNOSIS — J069 Acute upper respiratory infection, unspecified: Secondary | ICD-10-CM | POA: Diagnosis not present

## 2024-04-06 NOTE — ED Triage Notes (Signed)
 Used interpretor  Pt had coughing, stuffy nose and headache for 4 days. Pt hasn't taken any medications for her symptoms

## 2024-04-06 NOTE — Discharge Instructions (Signed)
 You can take acetaminophen , ibuprofen , and loratadine to help with your symptoms.  For most people and most upper respiratory infections, symptoms are self-limited. The usual course and duration of illness is up to 7-10 days. Because upper respiratory infections are usually caused by viruses, antibiotics will not help and often cause nausea and diarrhea.  Analgesics like Tylenol  (acetaminophen ) or Motrin  (ibuprofen ) may be used to relieve associated symptoms (eg, headache, ear pain, muscle and joint pains, and malaise). Supportive therapies are the main treatments you can perform including getting plenty of rest, drinking lots of fluids (water, juice, or broth), having warm tea or soup to help with sore throat, using cool-mist humidifier, using saline nose drops or spray to relieve stuffiness, and avoid smoking or being around smoke. You should return to the ED or UC if you experience any difficulty breathing, cough up blood, your symptoms do not improve/worsen, or if you are unable to keep down any food or liquids. Lastly, you should follow-up with your primary care provider in the next several days.

## 2024-04-06 NOTE — ED Provider Notes (Signed)
 [facility]    CSN: [id number] Arrival date & time: 04/06/24  0854      History   Chief Complaint Chief Complaint  Patient presents with   Cough   Nasal Congestion    HPI Katelyn Sharp is a 33 y.o. female.   HPI Patient is a 33 year old female who presents to the urgent care today with concerns of cough, headache, and congestion/runny nose.  She reports her symptoms began about 4 days ago.  She denies any other symptoms including fever, nausea, vomiting, diarrhea, sore throat, drooling, chest pain, shortness of breath, rash, or other concerns at this time.  She has not taken any medicines to help with her symptoms.  She is breast-feeding currently. Past Medical History:  Diagnosis Date   Essential hypertension    Type 2 diabetes mellitus (HCC)     Patient Active Problem List   Diagnosis Date Noted   Type 2 diabetes mellitus with hyperglycemia, without long-term current use of insulin  (HCC) 10/20/2023   Amenorrhea 09/01/2023   Counseling for initiation of birth control method 07/20/2022   Essential hypertension 12/28/2019   Language barrier 10/04/2019    Past Surgical History:  Procedure Laterality Date   NO PAST SURGERIES      OB History     Gravida  5   Para  5   Term  5   Preterm  0   AB  0   Living  5      SAB  0   IAB  0   Ectopic  0   Multiple  0   Live Births  5        Obstetric Comments  G1- 2014- SVD of boy, 2.5 kg- had episiotomy in G1   G2- 2016- [name], SVD, girl, 2.5 kg  G3- [name]- 2018- SVD, 2.5 kg           Home Medications    Prior to Admission medications   Medication Sig Start Date End Date Taking? Authorizing Provider  Accu-Chek Softclix Lancets lancets Use as instructed 10/20/23   Azell Boll, MD  amLODipine  (NORVASC ) 5 MG tablet Take 1 tablet (5 mg total) by mouth at bedtime. 10/20/23   Azell Boll, MD  Blood Glucose Monitoring Suppl (ACCU-CHEK AVIVA PLUS) w/Device KIT Use to check glucose  10/20/23   Azell Boll, MD  glucose blood (ACCU-CHEK AVIVA PLUS) test strip Use as instructed 10/20/23   Azell Boll, MD  hydrochlorothiazide  (HYDRODIURIL ) 25 MG tablet Take 1 tablet (25 mg total) by mouth daily. 09/01/23   Goble Last, MD  ketoconazole  (NIZORAL ) 2 % cream Apply 1 Application topically daily. For 2 weeks 10/20/23   Azell Boll, MD  metFORMIN  (GLUCOPHAGE ) 500 MG tablet Take 1 tablet (500 mg total) by mouth 2 (two) times daily with a meal. 10/20/23   Azell Boll, MD  Prenatal Vit-Fe Fumarate-FA (PRENATAL 19) tablet Chew 1 tablet by mouth daily. 09/19/23   Azell Boll, MD    Family History Family History  Problem Relation Age of Onset   Healthy Mother    Healthy Father    Bleeding Disorder Neg Hx    Hypertension Neg Hx    Diabetes Neg Hx     Social History Social History   Tobacco Use   Smoking status: Never   Smokeless tobacco: Never  Vaping Use   Vaping status: Never Used  Substance Use Topics   Alcohol use: Never   Drug use: Never  Allergies   Patient has no known allergies.   Review of Systems Review of Systems See HPI for relevant ROS.  Physical Exam Triage Vital Signs ED Triage Vitals  Encounter Vitals Group     BP 04/06/24 0919 131/82     Girls Systolic BP Percentile --      Girls Diastolic BP Percentile --      Boys Systolic BP Percentile --      Boys Diastolic BP Percentile --      Pulse Rate 04/06/24 0919 69     Resp 04/06/24 0919 17     Temp 04/06/24 0919 98 F (36.7 C)     Temp Source 04/06/24 0919 Oral     SpO2 04/06/24 0919 99 %     Weight --      Height --      Head Circumference --      Peak Flow --      Pain Score 04/06/24 0916 4     Pain Loc --      Pain Education --      Exclude from Growth Chart --    No data found.  Updated Vital Signs BP 131/82 (BP Location: Left Arm)   Pulse 69   Temp 98 F (36.7 C) (Oral)   Resp 17   LMP 03/31/2024 (Exact Date)   SpO2 99%   Breastfeeding Yes    Visual Acuity Right Eye Distance:   Left Eye Distance:   Bilateral Distance:    Right Eye Near:   Left Eye Near:    Bilateral Near:     Physical Exam General: Alert and oriented, well-developed/well-nourished, calm, cooperative, no acute distress HEENT: Normocephalic atraumatic, moist mucous membranes, no scleral icterus, trachea midline, pharynx without erythema or tonsillar swelling or exudate Lungs: Speaking full sentences, non-labored respirations, no distress, clear to auscultation bilaterally Heart: Regular rate and rhythm Abdomen:  Soft, nondistended Musculoskeletal: Moves all extremities well Neurologic: Awake, A&O x4, gait normal Integumentary: Warm, dry, normal for ethnicity, intact, no rash Psychiatric: Appropriate mood & affect  UC Treatments / Results  Labs (all labs ordered are listed, but only abnormal results are displayed) Labs Reviewed - No data to display  EKG   Radiology No results found.  Procedures Procedures (including critical care time)  Medications Ordered in UC Medications - No data to display  Initial Impression / Assessment and Plan / UC Course  I have reviewed the triage vital signs and the nursing notes.  Pertinent labs & imaging results that were available during my care of the patient were reviewed by me and considered in my medical decision making (see chart for details).    Presents with cough, runny nose/congestion, and headache.  Differential diagnosis includes: URI, COVID, influenza, sinusitis, pneumonia, headache, migraine, allergies, including other diagnoses.  History obtained from: Patient.  Plan: This patient presents with symptoms suspicious for likely viral upper respiratory infection. Based on history and physical doubt bacterial sinusitis. Do not suspect underlying cardiopulmonary process. Patient is nontoxic appearing, stable, and in no acute distress, therefore, patient likely stable for safe discharge home. Gave  patient recommendations for over-the-counter medicines and remedies that are safe during breast-feeding for symptomatic relief.  Gave patient recommendations to use LactMed for further advice on medications during breast-feeding. Patient should follow-up with their primary care provider in the next several days.  Strict return precautions to the urgent care or emergency department were discussed including if symptoms worsen, if they become short of breath, or if  they have any other concerns.  Disposition: Stable to discharge home.   All questions answered to the best of this examiner's ability. Advised to f/u with PCP for further eval and/or reassessment. Patient agrees to plan.  An appropriate evaluation has been performed, and in my medical judgment there is currently no evidence of an immediate life-threatening or surgical condition. Discharge is therefore indicated at this time.  This document was created using the aid of voice recognition Scientist, clinical (histocompatibility and immunogenetics).  Final Clinical Impressions(s) / UC Diagnoses   Final diagnoses:  Viral upper respiratory tract infection     Discharge Instructions      You can take acetaminophen , ibuprofen , and loratadine to help with your symptoms.  For most people and most upper respiratory infections, symptoms are self-limited. The usual course and duration of illness is up to 7-10 days. Because upper respiratory infections are usually caused by viruses, antibiotics will not help and often cause nausea and diarrhea.  Analgesics like Tylenol  (acetaminophen ) or Motrin  (ibuprofen ) may be used to relieve associated symptoms (eg, headache, ear pain, muscle and joint pains, and malaise). Supportive therapies are the main treatments you can perform including getting plenty of rest, drinking lots of fluids (water, juice, or broth), having warm tea or soup to help with sore throat, using cool-mist humidifier, using saline nose drops or spray to relieve stuffiness,  and avoid smoking or being around smoke. You should return to the ED or UC if you experience any difficulty breathing, cough up blood, your symptoms do not improve/worsen, or if you are unable to keep down any food or liquids. Lastly, you should follow-up with your primary care provider in the next several days.     ED Prescriptions   None    PDMP not reviewed this encounter.   Edna Gouty, PA-C 04/06/24 1028

## 2024-05-07 ENCOUNTER — Ambulatory Visit: Admitting: Skilled Nursing Facility1

## 2024-06-13 ENCOUNTER — Encounter (HOSPITAL_COMMUNITY): Payer: Self-pay

## 2024-06-13 ENCOUNTER — Ambulatory Visit (HOSPITAL_COMMUNITY)
Admission: EM | Admit: 2024-06-13 | Discharge: 2024-06-13 | Disposition: A | Discharge status: Home / Self Care | Attending: Family Medicine | Admitting: Family Medicine

## 2024-06-13 DIAGNOSIS — Z3202 Encounter for pregnancy test, result negative: Secondary | ICD-10-CM | POA: Diagnosis not present

## 2024-06-13 DIAGNOSIS — J029 Acute pharyngitis, unspecified: Secondary | ICD-10-CM

## 2024-06-13 DIAGNOSIS — R1013 Epigastric pain: Secondary | ICD-10-CM | POA: Diagnosis not present

## 2024-06-13 DIAGNOSIS — R1030 Lower abdominal pain, unspecified: Secondary | ICD-10-CM | POA: Diagnosis not present

## 2024-06-13 LAB — POCT URINALYSIS DIP (MANUAL ENTRY)
Bilirubin, UA: NEGATIVE
Glucose, UA: NEGATIVE mg/dL
Ketones, POC UA: NEGATIVE mg/dL
Leukocytes, UA: NEGATIVE
Nitrite, UA: NEGATIVE
Protein Ur, POC: NEGATIVE mg/dL
Spec Grav, UA: 1.02 (ref 1.010–1.025)
Urobilinogen, UA: 0.2 U/dL
pH, UA: 6 (ref 5.0–8.0)

## 2024-06-13 LAB — POCT URINE PREGNANCY: Preg Test, Ur: NEGATIVE

## 2024-06-13 MED ORDER — PANTOPRAZOLE SODIUM 20 MG PO TBEC: 20.0000 mg | DELAYED_RELEASE_TABLET | Freq: Every day | ORAL | 0 refills | Status: DC

## 2024-06-13 NOTE — Discharge Instructions (Signed)
 You have been seen today for abdominal pain. Your evaluation was not suggestive of any emergent condition requiring medical intervention at this time. However, some abdominal problems make take more time to appear. Therefore, it is very important for you to pay attention to any new symptoms or worsening of your current condition.  Please return here or to the Emergency Department immediately should you begin to feel worse in any way.

## 2024-06-13 NOTE — ED Triage Notes (Signed)
 Per interpreter, pt c/o sore throat x1wk and lower abdominal pain x4 days. Denies urinary sx's. Denies taken any meds for sx's. Denies fever.

## 2024-06-13 NOTE — ED Provider Notes (Signed)
 [facility]   [id number] 06/13/24 Arrival Time: 0803  ASSESSMENT & PLAN:  1. Abdominal pain, epigastric   2. Lower abdominal pain   3. Sore throat    Initially reported lower abd pain but with clarification she describes epigastric pain with occas ST. Feel this is likely GERD. Benign abdominal exam. No indications for urgent abdominal/pelvic imaging at this time. Discussed.  Trial: Meds ordered this encounter  Medications   pantoprazole  (PROTONIX ) 20 MG tablet    Sig: Take 1 tablet (20 mg total) by mouth daily.    Dispense:  30 tablet    Refill:  0   Work note provided.  Reviewed expectations re: course of current medical issues. Questions answered. Outlined signs and symptoms indicating need for more acute intervention. Patient verbalized understanding. After Visit Summary given.   SUBJECTIVE: History from: patient. Kiswahili interpreter. Katelyn Sharp is a 33 y.o. female who presents with complaint of non-radiating epigastric abd discomfort; past week; with assoc ST. Denies fever/n/v/diarrhea. Normal appetite and PO intake. No tx PTA. Patient's last menstrual period was 06/08/2024 (exact date). Denies urinary symptoms. Normal BMs.   Past Surgical History:  Procedure Laterality Date   NO PAST SURGERIES      OBJECTIVE:  Vitals:   06/13/24 0820  BP: (!) 161/92  Pulse: 65  Resp: 18  Temp: 98 F (36.7 C)  TempSrc: Oral  SpO2: 98%    General appearance: alert, oriented, no acute distress HEENT: [place]; AT; oropharynx moist; throat appears normal Lungs: unlabored respirations Abdomen: soft; without distention; mild  and poorly localized tenderness to palpation over epigastric abdomen; normal bowel sounds; without masses or organomegaly; without guarding or rebound tenderness Back: without reported CVA tenderness; FROM at waist Extremities: without LE edema; symmetrical; without gross deformities Skin: warm and dry Neurologic: normal  gait Psychological: alert and cooperative; normal mood and affect  Labs: Results for orders placed or performed during the hospital encounter of 06/13/24  POC urinalysis dipstick   Collection Time: 06/13/24  8:57 AM  Result Value Ref Range   Color, UA yellow yellow   Clarity, UA cloudy (A) clear   Glucose, UA negative negative mg/dL   Bilirubin, UA negative negative   Ketones, POC UA negative negative mg/dL   Spec Grav, UA 8.979 8.989 - 1.025   Blood, UA large (A) negative   pH, UA 6.0 5.0 - 8.0   Protein Ur, POC negative negative mg/dL   Urobilinogen, UA 0.2 0.2 or 1.0 E.U./dL   Nitrite, UA Negative Negative   Leukocytes, UA Negative Negative  POCT urine pregnancy   Collection Time: 06/13/24  8:59 AM  Result Value Ref Range   Preg Test, Ur Negative Negative   Labs Reviewed  POCT URINALYSIS DIP (MANUAL ENTRY) - Abnormal; Notable for the following components:      Result Value   Clarity, UA cloudy (*)    Blood, UA large (*)    All other components within normal limits  POCT URINE PREGNANCY    Imaging: No results found.   No Known Allergies                                             Past Medical History:  Diagnosis Date   Essential hypertension    Type 2 diabetes mellitus (HCC)     Social History   Socioeconomic History   Marital status:  Married    Spouse name: Not on file   Number of children: Not on file   Years of education: Not on file   Highest education level: Not on file  Occupational History   Not on file  Tobacco Use   Smoking status: Never   Smokeless tobacco: Never  Vaping Use   Vaping status: Never Used  Substance and Sexual Activity   Alcohol use: Never   Drug use: Never   Sexual activity: Yes    Partners: Male    Birth control/protection: None  Other Topics Concern   Not on file  Social History Narrative   Refugee Information   Number of Immediate Family Members: 4   Number of Immediate Family Members in [place] : 4   Date of Arrival:  06/28/19   Country of Birth: [facility]   Country of Origin: [facility]   Location of Refugee Camp: [place]   Duration in [place]: 20 years or greater   Reason for Leaving Home Country: Oceanographer opinion   Primary Language: Swahili/Kiswahili   Able to Read in Primary Language: Yes   Able to Write in Primary Language: Yes   Education: [employer]   Prior Work: Worked at home   Marital Status: Married   Sexual Activity: Yes   Tuberculosis Screening Health Department: Not Completed   Health Department Labs Completed: No(obtaining today)   History of Trauma: None   Do You Feel Jumpy or Nervous?: No   Are You Very Watchful or 'Super Alert'?: No   Social Drivers of Corporate investment banker Strain: Not on file  Food Insecurity: Not on file  Transportation Needs: Not on file  Physical Activity: Not on file  Stress: Not on file  Social Connections: Not on file  Intimate Partner Violence: Not on file    Family History  Problem Relation Age of Onset   Healthy Mother    Healthy Father    Bleeding Disorder Neg Hx    Hypertension Neg Hx    Diabetes Neg Hx      Rolinda Rogue, MD 06/13/24 [phone]

## 2024-07-07 ENCOUNTER — Telehealth: Payer: Self-pay

## 2024-07-07 NOTE — Telephone Encounter (Signed)
 Attempted to call patient to schedule a follow up with PCP. No Answer.  Naomie Alie Hardgrove RN BSN PCCN  [facility] [phone]-cell [phone]-office

## 2024-07-10 NOTE — Congregational Nurse Program (Signed)
  Dept: [phone]   Congregational Nurse Program Note  Date of Encounter: 07/10/2024  Past Medical History: Past Medical History:  Diagnosis Date   Essential hypertension    Type 2 diabetes mellitus [facility])     Encounter Details:  Community Questionnaire - 07/10/24 1028       Questionnaire   Ask client: Do you give verbal consent for me to treat you today? Yes    Student Assistance N/A    Location Patient Served  [facility]    Encounter Setting Phone/Text/Email    Population Status Migrant/Refugee    Insurance Medicaid    Insurance/Financial Assistance Referral N/A    Medication Have Medication Insecurities    Medical Provider Yes    Screening Referrals Made N/A    Medical Referrals Made [facility]    Medical Appointment Completed [facility]    CNP Interventions Advocate/Support;Navigate Healthcare System;Case Management;Counsel;Educate    Screenings CN Performed N/A    ED Visit Averted Yes    Life-Saving Intervention Made N/A         I have contacted [facility] and scheduled appointment s/p urgent care visit.  Naomie Alto Gandolfo RN BSN PCCN  [facility] [phone]-cell [phone]-office

## 2024-07-12 ENCOUNTER — Ambulatory Visit

## 2024-07-12 VITALS — BP 154/92 | HR 69 | Temp 99.0°F | Ht 67.0 in | Wt 181.2 lb

## 2024-07-12 DIAGNOSIS — K219 Gastro-esophageal reflux disease without esophagitis: Secondary | ICD-10-CM | POA: Diagnosis not present

## 2024-07-12 DIAGNOSIS — E1165 Type 2 diabetes mellitus with hyperglycemia: Secondary | ICD-10-CM | POA: Diagnosis not present

## 2024-07-12 DIAGNOSIS — I10 Essential (primary) hypertension: Secondary | ICD-10-CM | POA: Diagnosis not present

## 2024-07-12 LAB — POCT GLYCOSYLATED HEMOGLOBIN (HGB A1C): HbA1c, POC (controlled diabetic range): 6.6 % (ref 0.0–7.0)

## 2024-07-12 MED ORDER — PANTOPRAZOLE SODIUM 20 MG PO TBEC
40.0000 mg | DELAYED_RELEASE_TABLET | Freq: Every day | ORAL | 4 refills | Status: DC
Start: 2024-07-12 — End: 2024-09-18

## 2024-07-12 MED ORDER — HYDROCHLOROTHIAZIDE 25 MG PO TABS: 25.0000 mg | ORAL_TABLET | Freq: Every day | ORAL | 3 refills | Status: DC

## 2024-07-12 MED ORDER — METFORMIN HCL 500 MG PO TABS
500.0000 mg | ORAL_TABLET | Freq: Two times a day (BID) | ORAL | 5 refills | Status: DC
Start: 2024-07-12 — End: 2024-09-18

## 2024-07-12 MED ORDER — AMLODIPINE BESYLATE 5 MG PO TABS: 5.0000 mg | ORAL_TABLET | Freq: Every day | ORAL | 3 refills | Status: DC

## 2024-07-12 NOTE — Assessment & Plan Note (Signed)
-   Uncontrolled, has been out of medication. Refilled meds today. Suspect dizziness related to HTN. Medications refilled today.

## 2024-07-12 NOTE — Patient Instructions (Signed)
 Increased protonix  to 40mg  for acid reflux. Avoid spicy foods, acidic foods, and eating right before bed. Refilled blood pressure and diabetes medications.

## 2024-07-12 NOTE — Assessment & Plan Note (Signed)
-   stable, at goal, continue metformin

## 2024-07-12 NOTE — Progress Notes (Signed)
    SUBJECTIVE:   CHIEF COMPLAINT / HPI:  Abdominal Pain - Seen by UC on 8/20 for GERD symptoms, started on pantoprazole  20mg  - Slightly better today but still has tightening in throat, pressure, increased HR - Worse with eating or after eating - 1 month symptoms total, cannot identify trigger  HTN BP Readings from Last 4 Encounters:  07/12/24 (!) 154/92  06/13/24 (!) 161/92  04/06/24 131/82  10/20/23 120/72  - ran out of amlodipine  and hydrochlorothiazide  2 weeks ago - Has slightly dizziness intermittently with HA  DM2 Lab Results  Component Value Date   HGBA1C 6.6 07/12/2024  - metformin  1000mg  daily   PERTINENT  PMH / PSH: HTN, DM2  OBJECTIVE:   BP (!) 154/92   Pulse 69   Temp 99 F (37.2 C) (Oral)   Ht 5' 7 (1.702 m)   Wt 181 lb 3.2 oz (82.2 kg)   LMP 07/07/2024 (Exact Date)   SpO2 100%   BMI 28.38 kg/m   Physical Exam General: Alert, conversant, cooperative. No acute distress.  HEENT: PERRL. EOMI. MMM.  Cardiovascular: RRR Respiratory: Lungs CTAB. Normal work of breathing. Abdomen: Non distended Extremities: No cyanosis. No edema Musculoskeletal: No gross deformities.  Skin: Warm. Dry. No rashes. No icterus.  Neurologic: No focal deficits. Moving all extremities. Psychiatric: Cooperative. Appropriate mood. Appropriate affect.   ASSESSMENT/PLAN:   Assessment & Plan Type 2 diabetes mellitus with hyperglycemia, without long-term current use of insulin  (HCC) - stable, at goal, continue metformin  Gastroesophageal reflux disease without esophagitis - Uncontrolled, increased protonix  to 40mg  daily. Discussed avoidance of trigger foods, eating before bed. FU 2-3 months, consider BID dosing vs referral to GI if no improvement Essential hypertension - Uncontrolled, has been out of medication. Refilled meds today. Suspect dizziness related to HTN. Medications refilled today.      Katelyn LITTIE Deed, MD [facility]

## 2024-09-11 NOTE — Progress Notes (Signed)
    SUBJECTIVE:   CHIEF COMPLAINT: diabetes HPI:   Katelyn Sharp is a 33 y.o.  with history notable for type 2 DM and HTN presenting for follow up  The patient speaks swahili as their primary language.  An interpreter was used for the entire visit.  .  Patient reports she is having some mild nausea.  She reports her reflux has improved since her last visit.  She reports she does not know which medication she is taking.  She does not know the name strength or doses.  She did not take any medications today but she did take some yesterday.  Patient does not take any birth control.  She started her last menstrual period was around first week in October.  Her pregnancy test is positive today.  She is not taking a prenatal vitamin.  This was an unplanned pregnancy.  This is her 6 child.  She has had 5 prior spontaneous vaginal deliveries at term.She is happy but surprised by the pregnancy.   PERTINENT  PMH / PSH/Family/Social History : type 2 DM, HTN, uses natural family planning methods   OBJECTIVE:   BP 126/60   Pulse 64   Wt 179 lb 6.4 oz (81.4 kg)   SpO2 100%   BMI 28.10 kg/m   Today's weight:  Last Weight  Most recent update: 09/14/2024  9:57 AM    Weight  81.4 kg (179 lb 6.4 oz)            Review of prior weights: Filed Weights   09/14/24 0955  Weight: 179 lb 6.4 oz (81.4 kg)     Cardiac: Regular rate and rhythm. Normal S1/S2. No murmurs, rubs, or gallops appreciated. Lungs: Clear bilaterally to ascultation.  Abdomen: Normoactive bowel sounds. No tenderness to deep or light palpation. No rebound or guarding. Unable to appreciate uterus. Pelvic deferred.  Psych: Pleasant and appropriate    ASSESSMENT/PLAN:   Assessment & Plan Type 2 diabetes mellitus with hyperglycemia, without long-term current use of insulin  (HCC) Continue metformin  for now Sent in diabetes testing supplies as she will need to test throughout her pregnancy.  Recommend she test 2 hours after each  meal and in the morning when she is fasting.  Morning blood sugar goal less than 90 postprandial less than 120.  She is to bring the log to her follow-up visit. Essential hypertension Blood pressure at goal but unsure what she is taking would recommend transitioning her to nifedipine  at her next visit Missed menses Positive pregnancy test She is high risk due to her underlying type 2 diabetes and hypertension. I have asked her to pause taking her medications except metformin  and bring all of her medications with her to her next visit.  Would recommend transitioning her amlodipine  to nifedipine  XR  Start a prenatal vitamin  Start ASA 81 mg at 12 weeks   Prescribed Diclegis  for nausea and vomiting of pregnancy  Routine prenatal labs today  I have ordered her ultrasound communicated with CMA to schedule  I have referred her to high risk OB and will communicate the need for transfer of care and facilitate scheduling    Suzann Daring, MD  Family Medicine Teaching Service  [facility]

## 2024-09-14 ENCOUNTER — Ambulatory Visit: Admitting: Family Medicine

## 2024-09-14 ENCOUNTER — Other Ambulatory Visit (HOSPITAL_COMMUNITY): Payer: Self-pay

## 2024-09-14 ENCOUNTER — Encounter: Payer: Self-pay | Admitting: Family Medicine

## 2024-09-14 VITALS — BP 126/60 | HR 64 | Wt 179.4 lb

## 2024-09-14 DIAGNOSIS — O0991 Supervision of high risk pregnancy, unspecified, first trimester: Secondary | ICD-10-CM

## 2024-09-14 DIAGNOSIS — E1165 Type 2 diabetes mellitus with hyperglycemia: Secondary | ICD-10-CM

## 2024-09-14 DIAGNOSIS — N926 Irregular menstruation, unspecified: Secondary | ICD-10-CM | POA: Diagnosis not present

## 2024-09-14 DIAGNOSIS — I10 Essential (primary) hypertension: Secondary | ICD-10-CM

## 2024-09-14 DIAGNOSIS — Z3201 Encounter for pregnancy test, result positive: Secondary | ICD-10-CM

## 2024-09-14 DIAGNOSIS — O0990 Supervision of high risk pregnancy, unspecified, unspecified trimester: Secondary | ICD-10-CM | POA: Insufficient documentation

## 2024-09-14 LAB — POCT URINE PREGNANCY: Preg Test, Ur: POSITIVE — AB

## 2024-09-14 MED ORDER — ACCU-CHEK SOFTCLIX LANCETS MISC: 3 refills | Status: AC

## 2024-09-14 MED ORDER — DOXYLAMINE-PYRIDOXINE 10-10 MG PO TBEC: DELAYED_RELEASE_TABLET | ORAL | 3 refills | Status: DC

## 2024-09-14 MED ORDER — ACCU-CHEK SOFTCLIX LANCETS MISC: 12 refills | Status: AC

## 2024-09-14 MED ORDER — ACCU-CHEK AVIVA PLUS W/DEVICE KIT: PACK | 0 refills | Status: AC

## 2024-09-14 MED ORDER — ACCU-CHEK AVIVA PLUS VI STRP: ORAL_STRIP | 12 refills | Status: DC

## 2024-09-14 NOTE — Assessment & Plan Note (Signed)
 Blood pressure at goal but unsure what she is taking would recommend transitioning her to nifedipine  at her next visit

## 2024-09-14 NOTE — Assessment & Plan Note (Signed)
 Continue metformin  for now Sent in diabetes testing supplies as she will need to test throughout her pregnancy.  Recommend she test 2 hours after each meal and in the morning when she is fasting.  Morning blood sugar goal less than 90 postprandial less than 120.  She is to bring the log to her follow-up visit.

## 2024-09-14 NOTE — Patient Instructions (Addendum)
 It was wonderful to see you today.  Please bring ALL of your medications with you to every visit.   Today we talked about:  Your pregnancy test is positive  Please continue your metformin   Bring in all of your medications next week  Start a prenatal vitamin  I recommend checking sugars in the morning when you wake up and 2 hours after each meal  Bring a log with you  We ordered an ultrasound  You need to see Obstetrics--I have placed a referral   We will check blood work  I will call you with results    I have referred you to the [facility] to further evaluate your concern. If you have not received a phone call about this appointment within 3-4 weeks, please call our office back at [phone]. Margit Dimes coordinates our referrals and can assist you in this.   Naomie will help you get an appointment   Please follow up in 3 months   Thank you for choosing [facility].   Please call [phone] with any questions about today's appointment.  Please be sure to schedule follow up at the front  desk before you leave today.   Suzann Daring, MD  Family Medicine    Ilikuwa nzuri [place] leo.  Tafadhali leta dawa zako ZOTE kwa kila ziara.   Vila tulizungumza juu ya:  Kipimo chako cha ujauzito ni chanya  Tafadhali endelea na metformin  yako  Lete dawa zako zote wiki ijayo  Anza vitamini kabla ya kujifungua  Ninapendekeza kuangalia sukari asubuhi unapoamka na masaa 2 baada ya kila mlo  Leta logi nawe  Tuliagiza ultrasound  Unahitaji kuona Uzazi--nimeweka rufaa   Tutaangalia kazi ya damu  Nitakupigia simu na matokeo    Nimekuelekeza kwa [place] ili kutathmini zaidi wasiwasi wako. Ikiwa haujapokea simu kuhusu miadi hii ndani ya wiki 3-4, tafadhali piga simu ofisi yetu tena kwa [phone]. Margit Dimes huratibu marejeleo yetu na anaweza kukusaidia katika hili.   Dorothy atakusaidia kupata miadi   Tafadhali fuatilia baada ya  miezi 3   Asante kwa kuchagua Dawa ya Familia ya [place].   [place] piga simu [phone] na maswali yoyote kuhusu miadi ya leo.  Tafadhali yjxpxpdyj umepanga ufuatiliaji kwenye dawati la mbele kabla ya [place] leo.   Suzann Daring, MD  Lesta patricia Kaufmann

## 2024-09-15 ENCOUNTER — Ambulatory Visit: Payer: Self-pay | Admitting: Family Medicine

## 2024-09-16 NOTE — Congregational Nurse Program (Signed)
  Dept: [phone]   Congregational Nurse Program Note  Date of Encounter: 09/16/2024  Past Medical History: Past Medical History:  Diagnosis Date   Essential hypertension    Type 2 diabetes mellitus [facility])     Encounter Details:  Community Questionnaire - 09/16/24 0830       Questionnaire   Ask client: Do you give verbal consent for me to treat you today? Yes    Student Assistance N/A    Location Patient Served  [facility]    Encounter Setting Phone/Text/Email    Population Status Migrant/Refugee    Insurance Medicaid    Insurance/Financial Assistance Referral N/A    Medication Have Medication Insecurities    Medical Provider Yes    Screening Referrals Made N/A    Medical Referrals Made N/A    Medical Appointment Completed [facility] P[facility]    CNP Interventions Advocate/Support;Navigate Healthcare System;Case Management;Counsel;Educate    Screenings CN Performed N/A    ED Visit Averted Yes    Life-Saving Intervention Made N/A         I have reviewed upcoming appointment with patient over the phone.Advised to call or text me with questions or concerns.  Naomie Tashayla Therien RN BSN PCCN  [facility] [phone]-cell [phone]-office

## 2024-09-17 LAB — AB SCR+ANTIBODY ID

## 2024-09-18 ENCOUNTER — Ambulatory Visit (INDEPENDENT_AMBULATORY_CARE_PROVIDER_SITE_OTHER): Payer: Self-pay | Admitting: Student

## 2024-09-18 ENCOUNTER — Ambulatory Visit: Payer: Self-pay | Admitting: Student

## 2024-09-18 ENCOUNTER — Encounter: Payer: Self-pay | Admitting: Student

## 2024-09-18 ENCOUNTER — Telehealth: Payer: Self-pay | Admitting: Family Medicine

## 2024-09-18 VITALS — BP 139/69 | HR 70 | Ht 67.0 in | Wt 180.6 lb

## 2024-09-18 DIAGNOSIS — K219 Gastro-esophageal reflux disease without esophagitis: Secondary | ICD-10-CM

## 2024-09-18 DIAGNOSIS — I10 Essential (primary) hypertension: Secondary | ICD-10-CM | POA: Diagnosis not present

## 2024-09-18 DIAGNOSIS — E1165 Type 2 diabetes mellitus with hyperglycemia: Secondary | ICD-10-CM

## 2024-09-18 DIAGNOSIS — O99011 Anemia complicating pregnancy, first trimester: Secondary | ICD-10-CM

## 2024-09-18 LAB — PREGNANCY, INITIAL SCREEN
Basophils Absolute: 0 x10E3/uL (ref 0.0–0.2)
Basos: 0 %
Bilirubin, UA: NEGATIVE
EOS (ABSOLUTE): 0.1 x10E3/uL (ref 0.0–0.4)
Eos: 2 %
Glucose, UA: NEGATIVE
HCV Ab: NONREACTIVE
HIV Screen 4th Generation wRfx: NONREACTIVE
Hematocrit: 31.3 % — ABNORMAL LOW (ref 34.0–46.6)
Hemoglobin: 9.5 g/dL — ABNORMAL LOW (ref 11.1–15.9)
Hepatitis B Surface Ag: NEGATIVE
Immature Grans (Abs): 0 x10E3/uL (ref 0.0–0.1)
Immature Granulocytes: 0 %
Ketones, UA: NEGATIVE
Leukocytes,UA: NEGATIVE
Lymphocytes Absolute: 1.6 x10E3/uL (ref 0.7–3.1)
Lymphs: 35 %
MCH: 26.5 pg — ABNORMAL LOW (ref 26.6–33.0)
MCHC: 30.4 g/dL — ABNORMAL LOW (ref 31.5–35.7)
MCV: 87 fL (ref 79–97)
Monocytes Absolute: 0.5 x10E3/uL (ref 0.1–0.9)
Monocytes: 12 %
Neutrophils Absolute: 2.3 x10E3/uL (ref 1.4–7.0)
Neutrophils: 51 %
Nitrite, UA: NEGATIVE
Platelets: 346 x10E3/uL (ref 150–450)
Protein,UA: NEGATIVE
RBC, UA: NEGATIVE
RBC: 3.59 x10E6/uL — ABNORMAL LOW (ref 3.77–5.28)
RDW: 15.5 % — ABNORMAL HIGH (ref 11.7–15.4)
RPR Ser Ql: NONREACTIVE
Rh Factor: POSITIVE
Rubella Antibodies, IGG: 30.1 {index} (ref >0.99)
Specific Gravity, UA: 1.023 (ref 1.005–1.030)
Urobilinogen, Ur: 0.2 mg/dL (ref 0.2–1.0)
WBC: 4.5 x10E3/uL (ref 3.4–10.8)
pH, UA: 6 (ref 5.0–7.5)

## 2024-09-18 LAB — BASIC METABOLIC PANEL WITH GFR
BUN/Creatinine Ratio: 12 (ref 9–23)
BUN: 8 mg/dL (ref 6–20)
CO2: 20 mmol/L (ref 20–29)
Calcium: 9.6 mg/dL (ref 8.7–10.2)
Chloride: 102 mmol/L (ref 96–106)
Creatinine, Ser: 0.67 mg/dL (ref 0.57–1.00)
Glucose: 121 mg/dL — ABNORMAL HIGH (ref 70–99)
Potassium: 4.2 mmol/L (ref 3.5–5.2)
Sodium: 136 mmol/L (ref 134–144)
eGFR: 118 mL/min/1.73 (ref >59)

## 2024-09-18 LAB — MICROALBUMIN / CREATININE URINE RATIO
Creatinine, Urine: 173.4 mg/dL
Microalb/Creat Ratio: 2 mg/g{creat} (ref 0–29)
Microalbumin, Urine: 4.3 ug/mL

## 2024-09-18 LAB — MICROSCOPIC EXAMINATION
Bacteria, UA: NONE SEEN
Casts: NONE SEEN /LPF
RBC, Urine: NONE SEEN /HPF (ref 0–2)
WBC, UA: NONE SEEN /HPF (ref 0–5)

## 2024-09-18 LAB — AB SCR+ANTIBODY ID: Antibody Screen: POSITIVE — AB

## 2024-09-18 LAB — LIPID PANEL
Chol/HDL Ratio: 3.6 ratio (ref 0.0–4.4)
Cholesterol, Total: 155 mg/dL (ref 100–199)
HDL: 43 mg/dL (ref >39)
LDL Chol Calc (NIH): 96 mg/dL (ref 0–99)
Triglycerides: 87 mg/dL (ref 0–149)
VLDL Cholesterol Cal: 16 mg/dL (ref 5–40)

## 2024-09-18 LAB — URINE CULTURE, OB REFLEX: Organism ID, Bacteria: NO GROWTH

## 2024-09-18 LAB — HCV INTERPRETATION

## 2024-09-18 MED ORDER — METFORMIN HCL 500 MG PO TABS: 500.0000 mg | ORAL_TABLET | Freq: Two times a day (BID) | ORAL | 5 refills | Status: AC

## 2024-09-18 MED ORDER — NIFEDIPINE ER OSMOTIC RELEASE 30 MG PO TB24: 30.0000 mg | ORAL_TABLET | Freq: Every day | ORAL | 3 refills | Status: AC

## 2024-09-18 MED ORDER — FAMOTIDINE 20 MG PO TABS: 20.0000 mg | ORAL_TABLET | Freq: Two times a day (BID) | ORAL | 3 refills | Status: DC

## 2024-09-18 MED ORDER — FERROUS SULFATE 325 (65 FE) MG PO TABS
325.0000 mg | ORAL_TABLET | ORAL | 0 refills | Status: AC
Start: 2024-09-18 — End: ?

## 2024-09-18 NOTE — Telephone Encounter (Signed)
 Patient had multiple medication changes today.  CMA Team please call pharmacy and cancel amlodipine , pantoprazole , hydrochlorothiazide    Suzann Daring, MD  [facility] Medicine Teaching Service

## 2024-09-18 NOTE — Progress Notes (Signed)
    SUBJECTIVE:   CHIEF COMPLAINT / HPI:   Katelyn Sharp is a 33 year old female with hypertension and diabetes who presents for medication review during pregnancy.  She is pregnant and stopped her blood pressure, blood sugar, and acid reflux medications after she was advised to discontinue them after her pregnancy was discovered to review her medication and ensure she is not on any teratogenic medications. Patient was advised to continue her metformin  and check daily sugar until her visit today, however she report she hasn't been able to monitor her blood glucose because she hasn't picked up her glucometer from the pharmacy.  She has poor appetite and started an over-the-counter medication for this but is unsure if it is safe in pregnancy. She has not yet started prescribed prenatal vitamins but plans to obtain them today.  She denies nausea, vomiting, abdominal pain, or vaginal bleeding.  Online Swahili interpreter used for the entirety of the encounter.  PERTINENT  PMH / PSH: Reviewed   OBJECTIVE:   BP 139/69   Pulse 70   Ht 5' 7 (1.702 m)   Wt 180 lb 9.6 oz (81.9 kg)   SpO2 100%   BMI 28.29 kg/m    Physical Exam General: Alert, well appearing, NAD Cardiovascular: RRR, No Murmurs, Normal S2/S2 Respiratory: CTAB, No wheezing or Rales Abdomen: No distension or tenderness Extremities: No edema on extremities   Skin: Warm and dry  ASSESSMENT/PLAN:   Essential hypertension Hx of chronic HTN and currently pregnant. Previously on amlodipine  and hydrochlorothiazide , now discontinued. Procardia  (nifedipine ) initiated. - Started Procardia  (nifedipine ) 30 mg daily. - Maintain a daily blood pressure log.  Type 2 diabetes mellitus with hyperglycemia, without long-term current use of insulin  (HCC) Type 2 diabetes with hyperglycemia during pregnancy. Metformin  was stopped due to pregnancy. Blood sugar monitoring equipment not picked up. - Restart metformin  500 mg twice daily. -  Pick up and use blood sugar monitoring equipment. - Maintain a daily blood sugar log and bring to next appointment with PCP  Supervision of high-risk pregnancy Currently pregnant with HTN and DM. Referral to high-risk  OB already placed. - Ensure attendance at scheduled first prenatal ultrasound. -Contact and appointment info for US  provided to patient - Encouraged patient to pick up her Aspirin  and prenatal vitamin to stand meds immediately -Started patient on daily iron supplement for anemia   Gastroesophageal reflux disease in pregnancy GERD during pregnancy. Pantoprazole  replaced with famotidine . - Started famotidine  for GERD management.  Poor appetite in pregnancy Poor appetite during pregnancy. Unspecified appetite pill taken, safety unknown. - Hold off on taking the unspecified appetite pill until further evaluation.   Norleen April, MD [facility]

## 2024-09-18 NOTE — Assessment & Plan Note (Signed)
 Hx of chronic HTN and currently pregnant. Previously on amlodipine  and hydrochlorothiazide , now discontinued. Procardia  (nifedipine ) initiated. - Started Procardia  (nifedipine ) 30 mg daily. - Maintain a daily blood pressure log.

## 2024-09-18 NOTE — Assessment & Plan Note (Signed)
 Currently pregnant with HTN and DM. Referral to high-risk  OB already placed. - Ensure attendance at scheduled first prenatal ultrasound. -Contact and appointment info for US  provided to patient - Encouraged patient to pick up her Aspirin  and prenatal vitamin to stand meds immediately -Started patient on daily iron supplement for anemia

## 2024-09-18 NOTE — Patient Instructions (Addendum)
  Nina furaha kukutana nawe leo.  Leo tumeacha kutumia amlodipine  na HCTZ kwa shinikizo la damu yako.  Tunakuanza kwenye Procardia  ambayo utachukua 30 mg kila siku.  Tafadhali yjxpxpdyj kuwa umechukua dawa zako kwenye duka la dawa ikiwa ni pamoja na vitamini nyingi/vitamini kabla ya kuzaa na aspirini na uanze kuzitumia [place].  Pia nimebadilisha pantoprazole  yako kuwa famotidine .  Tafadhali kusanya vifaa vyako na uhakikishe kuwa umeangalia sukari yako ya damu, weka kumbukumbu ya kila siku ya sukari kwenye damu na uhakikishe kuwa unamfuata Dk. Delores burrow miadi yako ijayo ili kutathmini upya usomaji huu.  Kwa sasa umeratibiwa uchunguzi wako wa [place] wa ujauzito kabla ya [place] imeratibiwa [place] tarehe 11/26 saa 815am [facility]  Kituo cha Afya cha [facility] kwa Huduma ya Afya ya [facility] [address] [place], [place] [address] [phone]  Pleasure to meet you today.  Today we discontinued your amlodipine  and HCTZ for your blood pressure.  We are starting you on Procardia  which you will take 30 mg daily.  Please make sure to pick up your medications at the pharmacy including the multi vitamin/prenatal vitamin and aspirin  and start taking them as soon as possible.  Also I have switched your pantoprazole  to famotidine .  Please collect your supplies and make sure to check your blood sugars, keep a daily blood sugar log and make sure to follow-up with Dr. Delores at your next appointment to reassess these readings.  You are currently scheduled for your first prenatal ultrasound US  scheduled for 11/26 at 815am [facility]  [facility] [address] [place],  [place]  [address] [phone]

## 2024-09-18 NOTE — Assessment & Plan Note (Signed)
 Type 2 diabetes with hyperglycemia during pregnancy. Metformin  was stopped due to pregnancy. Blood sugar monitoring equipment not picked up. - Restart metformin  500 mg twice daily. - Pick up and use blood sugar monitoring equipment. - Maintain a daily blood sugar log and bring to next appointment with PCP

## 2024-09-18 NOTE — Telephone Encounter (Signed)
Done. Delray Alt, CMA

## 2024-09-19 ENCOUNTER — Encounter: Payer: Self-pay | Admitting: Family Medicine

## 2024-09-19 ENCOUNTER — Other Ambulatory Visit: Payer: Self-pay | Admitting: Family Medicine

## 2024-09-19 ENCOUNTER — Ambulatory Visit: Payer: Self-pay | Admitting: Family Medicine

## 2024-09-19 ENCOUNTER — Ambulatory Visit (INDEPENDENT_AMBULATORY_CARE_PROVIDER_SITE_OTHER)

## 2024-09-19 ENCOUNTER — Other Ambulatory Visit: Payer: Self-pay

## 2024-09-19 DIAGNOSIS — Z3A01 Less than 8 weeks gestation of pregnancy: Secondary | ICD-10-CM | POA: Diagnosis not present

## 2024-09-19 DIAGNOSIS — E1165 Type 2 diabetes mellitus with hyperglycemia: Secondary | ICD-10-CM

## 2024-09-19 DIAGNOSIS — N926 Irregular menstruation, unspecified: Secondary | ICD-10-CM

## 2024-09-19 DIAGNOSIS — Z3201 Encounter for pregnancy test, result positive: Secondary | ICD-10-CM

## 2024-09-19 DIAGNOSIS — I10 Essential (primary) hypertension: Secondary | ICD-10-CM

## 2024-09-19 DIAGNOSIS — O0991 Supervision of high risk pregnancy, unspecified, first trimester: Secondary | ICD-10-CM

## 2024-09-24 ENCOUNTER — Ambulatory Visit: Payer: Self-pay | Admitting: Family Medicine

## 2024-09-27 ENCOUNTER — Encounter: Payer: Self-pay | Admitting: Family Medicine

## 2024-09-27 ENCOUNTER — Ambulatory Visit: Admitting: Family Medicine

## 2024-09-27 ENCOUNTER — Telehealth: Payer: Self-pay

## 2024-09-27 ENCOUNTER — Other Ambulatory Visit (HOSPITAL_COMMUNITY): Payer: Self-pay

## 2024-09-27 VITALS — BP 134/91 | HR 68 | Temp 98.5°F | Wt 179.4 lb

## 2024-09-27 DIAGNOSIS — E1165 Type 2 diabetes mellitus with hyperglycemia: Secondary | ICD-10-CM

## 2024-09-27 DIAGNOSIS — O161 Unspecified maternal hypertension, first trimester: Secondary | ICD-10-CM

## 2024-09-27 DIAGNOSIS — O0991 Supervision of high risk pregnancy, unspecified, first trimester: Secondary | ICD-10-CM

## 2024-09-27 DIAGNOSIS — O361910 Maternal care for other isoimmunization, first trimester, not applicable or unspecified: Secondary | ICD-10-CM

## 2024-09-27 MED ORDER — DOXYLAMINE-PYRIDOXINE 10-10 MG PO TBEC: DELAYED_RELEASE_TABLET | ORAL | 3 refills | Status: AC

## 2024-09-27 MED ORDER — PRENATAL 19 PO CHEW: 1.0000 | CHEWABLE_TABLET | Freq: Every day | ORAL | 3 refills | Status: AC

## 2024-09-27 NOTE — Assessment & Plan Note (Addendum)
 Blood glucose not at goal  Start meformin back twice daily Bring log to OB visit  Has anemia in pregnancy Ferritin today Started ferrous sulfate   Given medical conditions, referred to Ob-Gyn for remainder of care, visits already scheduled

## 2024-09-27 NOTE — Patient Instructions (Addendum)
 It was wonderful to see you today.  Please bring ALL of your medications with you to every visit.   Today we talked about: - Taking medications as we discussed - Bring your sugar log to your next visit with the Obstetricians     Thank you for choosing [facility].   Please call [phone] with any questions about today's appointment.  Please be sure to schedule follow up at the front  desk before you leave today.   Suzann Daring, MD  Family Medicine    You will need to pick this up over the counter at [facility].  Tafadhali leta dawa zako ZOTE kwa kila ziara.   Leo tulizungumza juu ya: - Kuchukua dawa kama tulivyojadili - Leta logi yako ya sukari kwenye ziara yako inayofuata na [place] wa Uzazi     Asante kwa kuchagua Dawa ya Familia ya .   [place] piga simu [phone] na maswali yoyote kuhusu miadi ya leo.  Tafadhali yjxpxpdyj umepanga ufuatiliaji kwenye dawati la mbele kabla ya [place] leo.   Suzann Daring, MD  Dawa ya Familia    [place] hii juu ya consuelo burrow [employer]

## 2024-09-27 NOTE — Assessment & Plan Note (Signed)
 Unable to send titers to blood bank today  Needs repeat at least every trimester

## 2024-09-27 NOTE — Progress Notes (Signed)
    SUBJECTIVE:   CHIEF COMPLAINT: check sugars  HPI:   The patient speaks Swahili as their primary language.  An interpreter was used for the entire visit.    Katelyn Sharp is a 33 y.o. G6P5005 at [redacted]w[redacted]d dated by ultrasound. Period is uncertain.   She reports she is checking her BG. Before she eats, values range 98-110. She does not have her log with her today.  She is not taking any of her medications. No pelvic pain or bleeding.  Patients reports LMP of 07/29/24. This was LAST day of LMP. Uncertain first day.   PERTINENT  PMH / PSH/Family/Social History : type 2 DM, HTN   OBJECTIVE:   BP (!) 134/91   Pulse 68   Temp 98.5 F (36.9 C)   Wt 179 lb 6.4 oz (81.4 kg)   LMP 07/24/2024 (Approximate)   BMI 28.10 kg/m   Today's weight:  Last Weight  Most recent update: 09/27/2024 11:24 AM    Weight  81.4 kg (179 lb 6.4 oz)            Review of prior weights: [employer]   09/27/24 1123  Weight: 179 lb 6.4 oz (81.4 kg)   Appropriate well appearing  ASSESSMENT/PLAN:   Assessment & Plan Supervision of high risk pregnancy in first trimester Type 2 diabetes mellitus with hyperglycemia, without long-term current use of insulin  (HCC) Blood glucose not at goal  Start meformin back twice daily Bring log to OB visit  Has anemia in pregnancy Ferritin today Started ferrous sulfate   Given medical conditions, referred to Ob-Gyn for remainder of care, visits already scheduled  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

## 2024-09-27 NOTE — Telephone Encounter (Signed)
 No PA needed.   Pharmacy was able to fill for brand DIGLEGIS (preferred by insurance).

## 2024-09-27 NOTE — Assessment & Plan Note (Signed)
 Blood glucose not at goal  Start meformin back twice daily Bring log to OB visit  Has anemia in pregnancy Ferritin today Started ferrous sulfate   Given medical conditions, referred to Ob-Gyn for remainder of care, visits already scheduled

## 2024-09-28 ENCOUNTER — Ambulatory Visit: Payer: Self-pay | Admitting: Family Medicine

## 2024-09-28 LAB — FERRITIN: Ferritin: 9 ng/mL — ABNORMAL LOW (ref 15–150)

## 2024-10-10 ENCOUNTER — Encounter

## 2024-10-23 ENCOUNTER — Encounter: Admitting: Obstetrics and Gynecology

## 2024-11-06 ENCOUNTER — Telehealth: Payer: Self-pay

## 2024-11-06 NOTE — Telephone Encounter (Signed)
 Noted missed appointment with OBGYN. I contacted patient to reschedule, no answer. Voice message left.  Naomie Jefferey Lippmann RN BSN PCCN  [facility] [phone]-cell [phone]-office

## 2024-11-07 ENCOUNTER — Telehealth: Payer: Self-pay

## 2024-11-07 NOTE — Telephone Encounter (Signed)
 Two appointments scheduled with OBGYN, patient called and made aware.   Naomie Domique Clapper RN BSN PCCN  [facility] [phone]-cell [phone]-office

## 2024-11-13 ENCOUNTER — Telehealth: Payer: Self-pay | Admitting: Family Medicine

## 2024-11-13 NOTE — Telephone Encounter (Signed)
 Called patient with interpreter, no answer for neither number but left a detailed message to call the office about an appointment change.

## 2024-11-22 ENCOUNTER — Encounter: Payer: Self-pay | Admitting: *Deleted

## 2024-11-22 ENCOUNTER — Ambulatory Visit: Admitting: *Deleted

## 2024-11-22 ENCOUNTER — Other Ambulatory Visit: Payer: Self-pay | Admitting: *Deleted

## 2024-11-22 ENCOUNTER — Other Ambulatory Visit: Payer: Self-pay

## 2024-11-22 VITALS — BP 132/72 | HR 81 | Ht 64.5 in | Wt 174.3 lb

## 2024-11-22 DIAGNOSIS — O24312 Unspecified pre-existing diabetes mellitus in pregnancy, second trimester: Secondary | ICD-10-CM

## 2024-11-22 DIAGNOSIS — O24319 Unspecified pre-existing diabetes mellitus in pregnancy, unspecified trimester: Secondary | ICD-10-CM | POA: Insufficient documentation

## 2024-11-22 DIAGNOSIS — I10 Essential (primary) hypertension: Secondary | ICD-10-CM | POA: Diagnosis not present

## 2024-11-22 DIAGNOSIS — E1165 Type 2 diabetes mellitus with hyperglycemia: Secondary | ICD-10-CM | POA: Diagnosis not present

## 2024-11-22 DIAGNOSIS — O162 Unspecified maternal hypertension, second trimester: Secondary | ICD-10-CM

## 2024-11-22 DIAGNOSIS — Z3A16 16 weeks gestation of pregnancy: Secondary | ICD-10-CM | POA: Diagnosis not present

## 2024-11-22 DIAGNOSIS — O169 Unspecified maternal hypertension, unspecified trimester: Secondary | ICD-10-CM | POA: Insufficient documentation

## 2024-11-22 DIAGNOSIS — O0992 Supervision of high risk pregnancy, unspecified, second trimester: Secondary | ICD-10-CM

## 2024-11-22 DIAGNOSIS — O0990 Supervision of high risk pregnancy, unspecified, unspecified trimester: Secondary | ICD-10-CM

## 2024-11-22 MED ORDER — ACCU-CHEK AVIVA PLUS VI STRP: ORAL_STRIP | 12 refills | Status: AC

## 2024-11-22 MED ORDER — BLOOD PRESSURE KIT DEVI: 1.0000 | 0 refills | Status: AC | PRN

## 2024-11-22 MED ORDER — GOJJI WEIGHT SCALE MISC: 1.0000 | 0 refills | Status: AC

## 2024-11-22 NOTE — Progress Notes (Signed)
 Addendum: pregnancy risk form completed. Rock Skip PEAK

## 2024-11-22 NOTE — Progress Notes (Signed)
 New OB Intake  Patient came to office for Intake in person.  I explained I am completing New OB Intake today. We discussed EDD of 05/05/25 based on LMP of 07/29/24 which is consistent with US  done at [redacted]w[redacted]d.  We discussed at length that in her records from [facility] medicine she said LMP was approximately 10/5 and 07/24/24. She confirmed today she is sure her exact LMP was 07/29/24.  Pt is G6P5005. I reviewed her allergies, medications and Medical/Surgical/OB history.  She states she does not have test strips. RX sent in. Offered diabetes education appointment and scheduled.   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 Discussed the nature of our practice with multiple providers including residents and students as well as female and female providers. Due to the size of the practice, the delivering provider may not be the same as those providing prenatal care.   Patient is not a candidate for water birth.  MyChart/Babyscripts MyChart access not verified. I offered her to sign up - she declines due to language barrier. I explained pt will have some visits in office and some virtually. Babyscripts instructions not sent due to language barrier.    Blood Pressure Cuff/Weight Scale Blood pressure cuff ordered for patient to pick-up from [employer]. Explained after first prenatal appt pt will check weekly and document in Babyscripts. Patient does not have weight scale; order sent to [facility], patient may track weight weekly in Babyscripts.  Anatomy US  Explained first scheduled US  will be around 19 weeks. Anatomy US  scheduled for 12/29/23 at 1000.  Is patient a CenteringPregnancy candidate?  Not a Candidate Not a candidate due to Language barrier   Is patient a Mom+Baby Combined Care candidate?  Not a candidate    Is patient a candidate for Babyscripts Optimization? No, due to [facility], DM   First visit review I reviewed new OB appt with patient. Explained pt will be seen by Dr. Zina at first visit. Discussed [place] genetic screening with patient. She would like both  Panorama and Horizon drawn at her new ob visit. .. Routine prenatal labs have already been collected at previous provider.  I explained if additional labs are needed they will be done at new ob visit.   Last Pap Diagnosis  Date Value Ref Range Status  08/03/2022   Final   - Negative for intraepithelial lesion or malignancy (NILM)    Rock Shown, RN 11/22/2024  12:20 PM

## 2024-11-29 ENCOUNTER — Encounter: Payer: Self-pay | Admitting: Obstetrics and Gynecology

## 2024-11-29 ENCOUNTER — Other Ambulatory Visit: Payer: Self-pay

## 2024-11-29 ENCOUNTER — Other Ambulatory Visit (HOSPITAL_COMMUNITY): Admission: RE | Admit: 2024-11-29 | Source: Ambulatory Visit

## 2024-11-29 ENCOUNTER — Ambulatory Visit: Payer: Self-pay | Admitting: Obstetrics and Gynecology

## 2024-11-29 VITALS — BP 118/80 | HR 78 | Wt 179.9 lb

## 2024-11-29 DIAGNOSIS — O0990 Supervision of high risk pregnancy, unspecified, unspecified trimester: Secondary | ICD-10-CM

## 2024-11-29 DIAGNOSIS — O24312 Unspecified pre-existing diabetes mellitus in pregnancy, second trimester: Secondary | ICD-10-CM

## 2024-11-29 DIAGNOSIS — O361910 Maternal care for other isoimmunization, first trimester, not applicable or unspecified: Secondary | ICD-10-CM

## 2024-11-29 DIAGNOSIS — Z3A17 17 weeks gestation of pregnancy: Secondary | ICD-10-CM

## 2024-11-29 DIAGNOSIS — Z758 Other problems related to medical facilities and other health care: Secondary | ICD-10-CM

## 2024-11-29 DIAGNOSIS — O10919 Unspecified pre-existing hypertension complicating pregnancy, unspecified trimester: Secondary | ICD-10-CM

## 2024-11-29 LAB — GLUCOSE, CAPILLARY: Glucose-Capillary: 100 mg/dL — ABNORMAL HIGH (ref 70–99)

## 2024-11-29 MED ORDER — ASPIRIN 81 MG PO CHEW: 81.0000 mg | CHEWABLE_TABLET | Freq: Every day | ORAL | 3 refills | Status: AC

## 2024-11-29 NOTE — Addendum Note (Signed)
 Addended byBETHA WENCESLAO BROWNING on: 11/29/2024 04:45 PM   Modules accepted: Orders

## 2024-11-29 NOTE — Progress Notes (Signed)
 "  INITIAL PRENATAL VISIT NOTE  Subjective:  Katelyn Sharp is a 34 y.o. [id number] at [redacted]w[redacted]d by LMP being seen today for her initial prenatal visit.  She has an obstetric history significant for SVD x 5. She has a medical history significant for chronic hypertension and type 2 diabetes.  Patient reports no complaints.   . Vag. Bleeding: None.   . Denies leaking of fluid.    Past Medical History:  Diagnosis Date   Essential hypertension    Type 2 diabetes mellitus (HCC)     Past Surgical History:  Procedure Laterality Date   NO PAST SURGERIES      OB History  Gravida Para Term Preterm AB Living  6 5 5  0 0 5  SAB IAB Ectopic Multiple Live Births  0 0 0 0 5    # Outcome Date GA Lbr Len/2nd Weight Sex Type Anes PTL Lv  6 Current           5 Term 04/10/22 [redacted]w[redacted]d 04:38 / 01:59 8 lb 10.3 oz (3.92 kg) M Vag-Spont None  LIV     Birth Comments: Type 2 DM, HTN  4 Term 12/14/19 [redacted]w[redacted]d 00:24 / 00:10 8 lb 0.8 oz (3.65 kg) M Vag-Spont None  LIV     Birth Comments: 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, anemia  3 Term 2018   5 lb 8.2 oz (2.5 kg) F Vag-Spont None  LIV  2 Term 2016   5 lb 8.2 oz (2.5 kg) F Vag-Spont None  LIV     Birth Comments: wnl  1 Term 2014   5 lb 8.2 oz (2.5 kg) M Vag-Spont None  LIV     Birth Comments: wnl    Obstetric Comments  G1- 2014- SVD of boy, 2.5 kg- had episiotomy in G1    G2- 2016- [name], SVD, girl, 2.5 kg   G3- [name]- 2018- SVD, 2.5 kg     Social History   Socioeconomic History   Marital status: Married    Spouse name: Not on file   Number of children: Not on file   Years of education: Not on file   Highest education level: Not on file  Occupational History   Not on file  Tobacco Use   Smoking status: Never   Smokeless tobacco: Never  Vaping Use   Vaping status: Never Used  Substance and Sexual Activity   Alcohol use: Never   Drug use: Never   Sexual activity: Yes    Partners: Male    Birth control/protection: None     Comment: in the past used ocp's  Other Topics Concern   Not on file  Social History Narrative   Refugee Information   Number of Immediate Family Members: 4   Number of Immediate Family Members in [place] : 4   Date of Arrival: 06/28/19   Country of Birth: [facility]   Country of Origin: [place]   Location of Refugee Camp: [place]   Duration in [place]: 20 years or greater   Reason for Leaving Home Country: Oceanographer opinion   Primary Language: Swahili/Kiswahili   Able to Read in Primary Language: Yes   Able to Write in Primary Language: Yes   Education: [employer]   Prior Work: Worked at home   Marital Status: Married   Sexual Activity: Yes   Tuberculosis Screening Health Department: Not Completed   Health Department Labs Completed: No(obtaining today)   History of Trauma: None   Do You Feel Jumpy or  Nervous?: No   Are You Very Watchful or 'Super Alert'?: No   Social Drivers of Health   Tobacco Use: Low Risk (11/22/2024)   Patient History    Smoking Tobacco Use: Never    Smokeless Tobacco Use: Never    Passive Exposure: Not on file  Financial Resource Strain: Not on file  Food Insecurity: No Food Insecurity (11/22/2024)   Epic    Worried About Programme Researcher, Broadcasting/film/video in the Last Year: Never true    Ran Out of Food in the Last Year: Never true  Transportation Needs: No Transportation Needs (11/22/2024)   Epic    Lack of Transportation (Medical): No    Lack of Transportation (Non-Medical): No  Physical Activity: Not on file  Stress: Not on file  Social Connections: Not on file  Depression (PHQ2-9): Low Risk (11/29/2024)   Depression (PHQ2-9)    PHQ-2 Score: 0  Alcohol Screen: Not on file  Housing: Not on file  Utilities: Not on file  Health Literacy: Not on file    Family History  Problem Relation Age of Onset   Stroke Mother    Healthy Father    Bleeding Disorder Neg Hx    Hypertension Neg Hx    Diabetes Neg Hx     Current Medications[1]  Allergies[2]  Review of Systems:  Negative except for what is mentioned in HPI.  Objective:   Vitals:   11/29/24 0943  BP: 118/80  Pulse: 78  Weight: 179 lb 14.4 oz (81.6 kg)    Fetal Status: Fetal Heart Rate (bpm): 144         Physical Exam: BP 118/80   Pulse 78   Wt 179 lb 14.4 oz (81.6 kg)   LMP 07/29/2024 (Exact Date)   BMI 30.40 kg/m  CONSTITUTIONAL: Well-developed, well-nourished female in no acute distress.  NEUROLOGIC: Alert and oriented to person, place, and time. Normal reflexes, muscle tone coordination. No cranial nerve deficit noted. PSYCHIATRIC: Normal mood and affect. Normal behavior. Normal judgment and thought content. SKIN: Skin is warm and dry. No rash noted. Not diaphoretic. No erythema. No pallor. HENT:  Normocephalic, atraumatic, External right and left ear normal. Oropharynx is clear and moist EYES: Conjunctivae and EOM are normal.  NECK: Normal range of motion, supple, no masses CARDIOVASCULAR: Normal heart rate noted, regular rhythm RESPIRATORY: Effort and breath sounds normal, no problems with respiration noted BREASTS: deferred ABDOMEN: Soft, nontender, nondistended, gravid. GU: MUSCULOSKELETAL: Normal range of motion. EXT:  No edema and no tenderness. 2+ distal pulses.   Assessment and Plan:  Pregnancy: G6P5005 at [redacted]w[redacted]d by LMP  1. Supervision of high risk pregnancy, antepartum (Primary) Continue routine prenatal care  - Culture, OB Urine - GC/Chlamydia probe amp ([place])not at [facility] - CBC/D/Plt+RPR+Rh+ABO+RubIgG... - Hemoglobin A1c - Comprehensive metabolic panel with GFR - TSH - Protein / creatinine ratio, urine - PANORAMA PRENATAL TEST - HORIZON Basic Panel - AFP, Serum, Open Spina Bifida  2. [redacted] weeks gestation of pregnancy   3. Chronic hypertension affecting pregnancy Baseline preeclampsia labs Start baby ASA now Continue daily nifedipine  - aspirin  81 MG chewable tablet; Chew 1 tablet (81 mg total) by mouth daily.  Dispense: 90 tablet; Refill: 3  4.  Pre-existing diabetes mellitus during pregnancy in second trimester Patient is compliant with metformin  Unsure if patient really understands how to check blood sugars She has received accucheck training today, diabetic teaching is pending She may benefit from dexcom if patient is unable to check blood sugars regularly Will  refer to ophthalmology to check eyes - aspirin  81 MG chewable tablet; Chew 1 tablet (81 mg total) by mouth daily.  Dispense: 90 tablet; Refill: 3  5. Language barrier Pt speaks swahili, poor performance from live translator, unsure if either understood what was going on  6. 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  Please refer to After Visit Summary for other counseling recommendations.   Return in about 3 weeks (around 12/20/2024) for [facility], in person.  Jerilynn DELENA Buddle 11/29/2024 10:11 AM      [1]  Current Outpatient Medications:    Accu-Chek Softclix Lancets lancets, Use to check blood sugar 3 times daily, Disp: 100 each, Rfl: 3   Accu-Chek Softclix Lancets lancets, Use as instructed, Disp: 100 each, Rfl: 12   aspirin  81 MG chewable tablet, Chew 1 tablet (81 mg total) by mouth daily., Disp: 90 tablet, Rfl: 3   Blood Glucose Monitoring Suppl (ACCU-CHEK AVIVA PLUS) w/Device KIT, Use to check glucose, Disp: 1 kit, Rfl: 0   Blood Pressure Monitoring (BLOOD PRESSURE KIT) DEVI, 1 Device by Does not apply route as needed., Disp: 1 each, Rfl: 0   Doxylamine -Pyridoxine  (DICLEGIS ) 10-10 MG TBEC, Two tablets at bedtime on days 1 and 2; then- take 1 tablet in morning and 2 tablets at  bedtime on day 3; if symptoms persist, may further increase to 1 tablet in morning, 1 tablet mid-afternoon, and 2 tablets at bedtime., Disp: 90 tablet, Rfl: 3   ferrous sulfate  325 (65 FE) MG EC tablet, Take 1 tablet by mouth every other day., Disp: , Rfl:    metFORMIN  (GLUCOPHAGE ) 500 MG tablet, Take 1 tablet (500 mg total) by mouth 2 (two) times daily with a meal., Disp: 60 tablet, Rfl: 5   Misc. Devices (GOJJI WEIGHT SCALE) MISC, 1 Device by Does not apply route once a week., Disp: 1 each, Rfl: 0   NIFEdipine  (PROCARDIA -XL/NIFEDICAL-XL) 30 MG 24 hr tablet, Take 1 tablet (30 mg total) by mouth at bedtime., Disp: 90 tablet, Rfl: 3   Prenatal Vit-Fe Fumarate-FA (PRENATAL 19) tablet, Chew 1 tablet by mouth daily., Disp: 90 tablet, Rfl: 3   ferrous sulfate  325 (65 FE) MG tablet, Take 1 tablet (325 mg total) by mouth every other day. (Patient not taking: Reported on 11/22/2024), Disp: 45 tablet, Rfl: 0   glucose blood (ACCU-CHEK AVIVA PLUS) test strip, Use four times per day as instructed (Patient not taking: Reported on 11/22/2024), Disp: 100 each, Rfl: 12 [2] No Known Allergies  "

## 2024-11-30 LAB — GC/CHLAMYDIA PROBE AMP (~~LOC~~) NOT AT ARMC
Chlamydia: NEGATIVE
Comment: NEGATIVE
Comment: NORMAL
Neisseria Gonorrhea: NEGATIVE

## 2024-11-30 LAB — PROTEIN / CREATININE RATIO, URINE
Creatinine, Urine: 140.6 mg/dL
Protein, Ur: 12.7 mg/dL
Protein/Creat Ratio: 90 mg/g{creat} (ref 0–200)

## 2024-12-04 ENCOUNTER — Other Ambulatory Visit

## 2024-12-17 ENCOUNTER — Encounter: Payer: Self-pay | Admitting: Obstetrics and Gynecology

## 2024-12-28 ENCOUNTER — Other Ambulatory Visit

## 2024-12-28 ENCOUNTER — Ambulatory Visit
